# Patient Record
Sex: Female | Born: 1955 | Race: White | Hispanic: No | Smoking: Current every day smoker
Health system: Southern US, Community
[De-identification: ages and names within clinical notes are randomized; demographics above are authoritative.]

## PROBLEM LIST (undated history)

## (undated) DIAGNOSIS — N6019 Diffuse cystic mastopathy of unspecified breast: Secondary | ICD-10-CM

## (undated) DIAGNOSIS — I10 Essential (primary) hypertension: Secondary | ICD-10-CM

## (undated) DIAGNOSIS — J449 Chronic obstructive pulmonary disease, unspecified: Secondary | ICD-10-CM

## (undated) DIAGNOSIS — K219 Gastro-esophageal reflux disease without esophagitis: Secondary | ICD-10-CM

## (undated) DIAGNOSIS — N19 Unspecified kidney failure: Secondary | ICD-10-CM

## (undated) DIAGNOSIS — M1990 Unspecified osteoarthritis, unspecified site: Secondary | ICD-10-CM

## (undated) DIAGNOSIS — Z8619 Personal history of other infectious and parasitic diseases: Secondary | ICD-10-CM

## (undated) DIAGNOSIS — J189 Pneumonia, unspecified organism: Secondary | ICD-10-CM

## (undated) HISTORY — DX: Personal history of other infectious and parasitic diseases: Z86.19

## (undated) HISTORY — DX: Gastro-esophageal reflux disease without esophagitis: K21.9

## (undated) HISTORY — DX: Diffuse cystic mastopathy of unspecified breast: N60.19

## (undated) HISTORY — DX: Pneumonia, unspecified organism: J18.9

## (undated) HISTORY — DX: Essential (primary) hypertension: I10

## (undated) HISTORY — DX: Unspecified osteoarthritis, unspecified site: M19.90

## (undated) HISTORY — DX: Unspecified kidney failure: N19

## (undated) HISTORY — PX: CARPAL TUNNEL RELEASE: SHX101

## (undated) HISTORY — DX: Chronic obstructive pulmonary disease, unspecified: J44.9

---

## 1981-02-15 HISTORY — PX: ABDOMINAL HYSTERECTOMY: SHX81

## 2006-02-15 DIAGNOSIS — J189 Pneumonia, unspecified organism: Secondary | ICD-10-CM

## 2006-02-15 HISTORY — DX: Pneumonia, unspecified organism: J18.9

## 2007-08-07 ENCOUNTER — Emergency Department: Payer: Self-pay

## 2007-08-11 ENCOUNTER — Inpatient Hospital Stay: Payer: Self-pay | Admitting: Internal Medicine

## 2007-08-11 ENCOUNTER — Other Ambulatory Visit: Payer: Self-pay

## 2007-09-05 ENCOUNTER — Emergency Department: Payer: Self-pay | Admitting: Internal Medicine

## 2007-12-28 ENCOUNTER — Emergency Department: Payer: Self-pay | Admitting: Emergency Medicine

## 2008-04-12 ENCOUNTER — Emergency Department: Payer: Self-pay | Admitting: Emergency Medicine

## 2008-12-02 ENCOUNTER — Emergency Department: Payer: Self-pay | Admitting: Emergency Medicine

## 2008-12-05 ENCOUNTER — Emergency Department: Payer: Self-pay | Admitting: Emergency Medicine

## 2008-12-30 ENCOUNTER — Encounter: Admission: RE | Admit: 2008-12-30 | Discharge: 2008-12-30 | Payer: Self-pay | Admitting: Internal Medicine

## 2009-07-05 ENCOUNTER — Emergency Department: Payer: Self-pay | Admitting: Emergency Medicine

## 2009-12-25 ENCOUNTER — Emergency Department: Payer: Self-pay | Admitting: Emergency Medicine

## 2009-12-28 ENCOUNTER — Emergency Department: Payer: Self-pay | Admitting: Emergency Medicine

## 2010-12-08 ENCOUNTER — Ambulatory Visit: Payer: Self-pay | Admitting: Specialist

## 2011-02-11 ENCOUNTER — Encounter: Payer: Self-pay | Admitting: Internal Medicine

## 2011-02-11 ENCOUNTER — Ambulatory Visit (INDEPENDENT_AMBULATORY_CARE_PROVIDER_SITE_OTHER): Payer: BC Managed Care – PPO | Admitting: Internal Medicine

## 2011-02-11 DIAGNOSIS — M129 Arthropathy, unspecified: Secondary | ICD-10-CM

## 2011-02-11 DIAGNOSIS — F419 Anxiety disorder, unspecified: Secondary | ICD-10-CM | POA: Insufficient documentation

## 2011-02-11 DIAGNOSIS — S43429A Sprain of unspecified rotator cuff capsule, initial encounter: Secondary | ICD-10-CM

## 2011-02-11 DIAGNOSIS — J449 Chronic obstructive pulmonary disease, unspecified: Secondary | ICD-10-CM | POA: Insufficient documentation

## 2011-02-11 DIAGNOSIS — G4700 Insomnia, unspecified: Secondary | ICD-10-CM

## 2011-02-11 DIAGNOSIS — M75101 Unspecified rotator cuff tear or rupture of right shoulder, not specified as traumatic: Secondary | ICD-10-CM

## 2011-02-11 DIAGNOSIS — M1990 Unspecified osteoarthritis, unspecified site: Secondary | ICD-10-CM

## 2011-02-11 DIAGNOSIS — Z1239 Encounter for other screening for malignant neoplasm of breast: Secondary | ICD-10-CM

## 2011-02-11 DIAGNOSIS — R229 Localized swelling, mass and lump, unspecified: Secondary | ICD-10-CM

## 2011-02-11 DIAGNOSIS — R2230 Localized swelling, mass and lump, unspecified upper limb: Secondary | ICD-10-CM

## 2011-02-11 DIAGNOSIS — F411 Generalized anxiety disorder: Secondary | ICD-10-CM

## 2011-02-11 DIAGNOSIS — I10 Essential (primary) hypertension: Secondary | ICD-10-CM

## 2011-02-11 MED ORDER — IBUPROFEN 800 MG PO TABS: 800.0000 mg | ORAL_TABLET | Freq: Three times a day (TID) | ORAL | Status: DC | PRN

## 2011-02-11 MED ORDER — ESZOPICLONE 2 MG PO TABS: 2.0000 mg | ORAL_TABLET | Freq: Every day | ORAL | Status: DC

## 2011-02-11 MED ORDER — ALPRAZOLAM 1 MG PO TABS: 1.0000 mg | ORAL_TABLET | Freq: Two times a day (BID) | ORAL | Status: DC | PRN

## 2011-02-11 MED ORDER — LISINOPRIL-HYDROCHLOROTHIAZIDE 10-12.5 MG PO TABS: 2.0000 | ORAL_TABLET | Freq: Every day | ORAL | Status: DC

## 2011-02-11 MED ORDER — ALBUTEROL SULFATE (2.5 MG/3ML) 0.083% IN NEBU: 2.5000 mg | INHALATION_SOLUTION | Freq: Four times a day (QID) | RESPIRATORY_TRACT | Status: AC | PRN

## 2011-02-11 MED ORDER — FLUTICASONE-SALMETEROL 500-50 MCG/DOSE IN AEPB: 1.0000 | INHALATION_SPRAY | Freq: Two times a day (BID) | RESPIRATORY_TRACT | Status: DC

## 2011-02-11 NOTE — Progress Notes (Signed)
Subjective:    Patient ID: Teresa Copeland, female    DOB: [birth year], 55 y.o.   MRN: [id number]  HPI 55 year old female with a history of COPD presents for followup. She was seen previously at our former office. She has several concerns today. First, she reports significant difficulty falling asleep. She notes that she has been working long days at work, typically 12 hour shifts. When she gets home she is unable to follow sleep and typically only sleeps a couple of hours each night. Because of this she reports that she is constantly exhausted. She notes that she used Lunesta in the past with some improvement in her symptoms.  She also notes significant increase in her anxiety. She relates this to both the family events and to stress at work. She notes that in the past her blood pressure remained elevated until her anxiety was treated with Xanax. She is interested in restarting this medication today. She reports significant improvement in her anxiety with this medication the past.  She notes a long history of hypertension. She reports that she has been taking her lisinopril hydrochlorothiazide as directed. She notes that her blood pressure is typically higher when she is at the doctor's office. She did not bring a record of her blood pressure at home today. She has not recently had her renal function checked. She denies any chest pain, headache, visual changes.  She also notes a long history of smoking and history of COPD. She has not been taking her Advair because of cost. She denies any current shortness of breath or cough. She denies any fever or chills. She continues to smoke.  She is also concerned today about an area of fullness on the left side of her chest. She notes this has been present for several months. The area is not painful. She did not have any trauma to her chest. She has not noticed any changes in her breasts. She does note that her last mammogram was in 2008 and she was instructed to have  followup within 6 months. She did not keep the followup appointment.  Outpatient Encounter Prescriptions as of 02/11/2011  Medication Sig Dispense Refill  . albuterol (PROVENTIL HFA;VENTOLIN HFA) 108 (90 BASE) MCG/ACT inhaler Inhale 2 puffs into the lungs every 6 (six) hours as needed.        Marland Kitchen albuterol (PROVENTIL) (2.5 MG/3ML) 0.083% nebulizer solution Take 3 mLs (2.5 mg total) by nebulization every 6 (six) hours as needed.  75 mL  3  . ALPRAZolam (XANAX) 1 MG tablet Take 1 tablet (1 mg total) by mouth 2 (two) times daily as needed for sleep.  60 tablet  3  . eszopiclone (LUNESTA) 2 MG TABS Take 1 tablet (2 mg total) by mouth at bedtime. Take immediately before bedtime  30 tablet  0  . Fluticasone-Salmeterol (ADVAIR) 500-50 MCG/DOSE AEPB Inhale 1 puff into the lungs every 12 (twelve) hours.  60 each  6  . ibuprofen (ADVIL,MOTRIN) 800 MG tablet Take 1 tablet (800 mg total) by mouth every 8 (eight) hours as needed for pain.  90 tablet  0  . lisinopril-hydrochlorothiazide (PRINZIDE,ZESTORETIC) 10-12.5 MG per tablet Take 2 tablets by mouth daily.  60 tablet  3  . valACYclovir (VALTREX) 500 MG tablet         Review of Systems  Constitutional: Negative for fever, chills, appetite change, fatigue and unexpected weight change.  HENT: Negative for ear pain, congestion, sore throat, trouble swallowing, neck pain, voice change and sinus pressure.  Eyes: Negative for visual disturbance.  Respiratory: Negative for cough, shortness of breath, wheezing and stridor.   Cardiovascular: Negative for chest pain, palpitations and leg swelling.  Gastrointestinal: Negative for nausea, vomiting, abdominal pain, diarrhea, constipation, blood in stool, abdominal distention and anal bleeding.  Genitourinary: Negative for dysuria and flank pain.  Musculoskeletal: Positive for myalgias and arthralgias. Negative for gait problem.  Skin: Negative for color change and rash.  Neurological: Negative for dizziness and  headaches.  Hematological: Negative for adenopathy. Does not bruise/bleed easily.  Psychiatric/Behavioral: Positive for sleep disturbance. Negative for suicidal ideas and dysphoric mood. The patient is nervous/anxious.    BP 208/110  Pulse 81  Temp(Src) 98.5 F (36.9 C) (Oral)  Wt 177 lb (80.287 kg)  SpO2 99%     Objective:   Physical Exam  Constitutional: She is oriented to person, place, and time. She appears well-developed and well-nourished. No distress.  HENT:  Head: Normocephalic and atraumatic.  Right Ear: External ear normal.  Left Ear: External ear normal.  Nose: Nose normal.  Mouth/Throat: Oropharynx is clear and moist. No oropharyngeal exudate.  Eyes: Conjunctivae are normal. Pupils are equal, round, and reactive to light. Right eye exhibits no discharge. Left eye exhibits no discharge. No scleral icterus.  Neck: Normal range of motion. Neck supple. No tracheal deviation present. No thyromegaly present.  Cardiovascular: Normal rate, regular rhythm, normal heart sounds and intact distal pulses.  Exam reveals no gallop and no friction rub.   No murmur heard. Pulmonary/Chest: Effort normal. No accessory muscle usage. Not tachypneic. No respiratory distress. She has decreased breath sounds (prolonged expiration). She has no wheezes. She has no rales. She exhibits no tenderness. Right breast exhibits no inverted nipple, no mass, no nipple discharge, no skin change and no tenderness. Left breast exhibits no inverted nipple, no mass, no nipple discharge, no skin change and no tenderness. Breasts are asymmetrical.    Musculoskeletal: Normal range of motion. She exhibits no edema and no tenderness.  Lymphadenopathy:    She has no cervical adenopathy.  Neurological: She is alert and oriented to person, place, and time. No cranial nerve deficit. She exhibits normal muscle tone. Coordination normal.  Skin: Skin is warm and dry. No rash noted. She is not diaphoretic. No erythema. No  pallor.  Psychiatric: She has a normal mood and affect. Her behavior is normal. Judgment and thought content normal.          Assessment & Plan:  1. Insomnia -will restart Lunesta. Samples were given today. Patient will followup in one month.  2. Anxiety -will restart Xanax. Patient was given prescription for this today. She will followup in one month.  3. Hypertension -blood pressure is markedly elevated. Will increase her lisinopril hydrochlorothiazide to twice daily. She will followup for her nurse blood pressure check in one week. She will followup for lab work including creatinine and potassium levels in one week. She will return to clinic for an office visit in one month.  4. Left axillary fullness -patient has an area of fullness in her left axilla and lateral chest wall. Question if this might be a lipoma. Will get an ultrasound of the area for evaluation.  5. Breast cancer screening -patient is overdue for her mammogram. We'll schedule this for her today.  6. COPD - will restart Advair and albuterol. If Advair is too expensive with her new insurance will consider change to spiriva. Strongly encourage smoking cessation. Patient will followup in one month.

## 2011-02-12 ENCOUNTER — Other Ambulatory Visit: Payer: Self-pay | Admitting: *Deleted

## 2011-02-12 ENCOUNTER — Telehealth: Payer: Self-pay | Admitting: Internal Medicine

## 2011-02-12 ENCOUNTER — Ambulatory Visit: Payer: Self-pay | Admitting: Internal Medicine

## 2011-02-12 DIAGNOSIS — M1990 Unspecified osteoarthritis, unspecified site: Secondary | ICD-10-CM

## 2011-02-12 DIAGNOSIS — J449 Chronic obstructive pulmonary disease, unspecified: Secondary | ICD-10-CM

## 2011-02-12 MED ORDER — IBUPROFEN 800 MG PO TABS: 800.0000 mg | ORAL_TABLET | Freq: Three times a day (TID) | ORAL | Status: DC | PRN

## 2011-02-12 MED ORDER — FLUTICASONE-SALMETEROL 500-50 MCG/DOSE IN AEPB: 1.0000 | INHALATION_SPRAY | Freq: Two times a day (BID) | RESPIRATORY_TRACT | Status: DC

## 2011-02-12 NOTE — Telephone Encounter (Signed)
[place] of left chest wall was normal.  If symptoms persist, we can consider MRI of the shoulder. We can discuss at her follow up visit.

## 2011-02-12 NOTE — Telephone Encounter (Signed)
Left detailed VM on hm #

## 2011-02-12 NOTE — Telephone Encounter (Signed)
Order was e-prescribed today, and I resent it now.

## 2011-02-12 NOTE — Telephone Encounter (Signed)
I called patient to let her know that this was sent again by Dr. Dan Humphreys to [facility].  She is going to call them and make sure they do have it.

## 2011-02-18 ENCOUNTER — Other Ambulatory Visit (INDEPENDENT_AMBULATORY_CARE_PROVIDER_SITE_OTHER): Payer: BC Managed Care – PPO | Admitting: *Deleted

## 2011-02-18 ENCOUNTER — Telehealth: Payer: Self-pay | Admitting: *Deleted

## 2011-02-18 ENCOUNTER — Ambulatory Visit (INDEPENDENT_AMBULATORY_CARE_PROVIDER_SITE_OTHER): Payer: BC Managed Care – PPO | Admitting: *Deleted

## 2011-02-18 VITALS — BP 140/86 | HR 80 | Resp 16

## 2011-02-18 DIAGNOSIS — I10 Essential (primary) hypertension: Secondary | ICD-10-CM

## 2011-02-18 LAB — BASIC METABOLIC PANEL
BUN: 11 mg/dL (ref 6–23)
Chloride: 106 mEq/L (ref 96–112)
Creatinine, Ser: 0.9 mg/dL (ref 0.4–1.2)
GFR: 72.73 mL/min (ref >60.00)
Glucose, Bld: 92 mg/dL (ref 70–99)

## 2011-02-18 NOTE — Telephone Encounter (Signed)
Patient is asking if she can get a rx for diflucan for thrush. She says that every time she has ever used the advair she gets thrush in her mouth. Also she is asking if she could get handicap placard since she is a copd patient.

## 2011-02-18 NOTE — Progress Notes (Signed)
Patient came in for blood pressure check today as instructed. When she was here for her office visit her blood pressure was elevated at 208/110. Today it was 140/86. Medication reviewed, patient is taking lisinopril-hydrochlorothiazide 10-12.5, two tabs daily.

## 2011-02-20 NOTE — Telephone Encounter (Signed)
Fine to call in Diflucan 150mg  daily disp 1.  Fine to give Handicap tag.

## 2011-02-24 ENCOUNTER — Encounter: Payer: Self-pay | Admitting: Internal Medicine

## 2011-03-08 ENCOUNTER — Telehealth: Payer: Self-pay | Admitting: *Deleted

## 2011-03-08 NOTE — Telephone Encounter (Signed)
Patient requesting RX for diflucan for thrush in her mouth. Please advise.

## 2011-03-08 NOTE — Telephone Encounter (Signed)
Fine to call in diflucan 150mg  po daily x 3 days.

## 2011-03-09 MED ORDER — FLUCONAZOLE 150 MG PO TABS: 150.0000 mg | ORAL_TABLET | Freq: Once | ORAL | Status: DC

## 2011-03-09 NOTE — Telephone Encounter (Signed)
Patient notified to check w/ pharmacy for rx.

## 2011-03-15 ENCOUNTER — Ambulatory Visit (INDEPENDENT_AMBULATORY_CARE_PROVIDER_SITE_OTHER): Payer: BC Managed Care – PPO | Admitting: Internal Medicine

## 2011-03-15 ENCOUNTER — Encounter: Payer: Self-pay | Admitting: Internal Medicine

## 2011-03-15 ENCOUNTER — Ambulatory Visit (INDEPENDENT_AMBULATORY_CARE_PROVIDER_SITE_OTHER)
Admission: RE | Admit: 2011-03-15 | Discharge: 2011-03-15 | Disposition: A | Discharge status: Home / Self Care | Payer: BC Managed Care – PPO | Source: Ambulatory Visit | Attending: Internal Medicine | Admitting: Internal Medicine

## 2011-03-15 VITALS — BP 146/78 | HR 106 | Temp 98.2°F | Ht 68.0 in | Wt 172.0 lb

## 2011-03-15 DIAGNOSIS — M79609 Pain in unspecified limb: Secondary | ICD-10-CM

## 2011-03-15 DIAGNOSIS — S81809A Unspecified open wound, unspecified lower leg, initial encounter: Secondary | ICD-10-CM

## 2011-03-15 DIAGNOSIS — I10 Essential (primary) hypertension: Secondary | ICD-10-CM

## 2011-03-15 DIAGNOSIS — M79602 Pain in left arm: Secondary | ICD-10-CM

## 2011-03-15 DIAGNOSIS — S81801A Unspecified open wound, right lower leg, initial encounter: Secondary | ICD-10-CM

## 2011-03-15 DIAGNOSIS — G4700 Insomnia, unspecified: Secondary | ICD-10-CM

## 2011-03-15 MED ORDER — HYDROCODONE-ACETAMINOPHEN 5-500 MG PO TABS: 1.0000 | ORAL_TABLET | Freq: Four times a day (QID) | ORAL | Status: DC | PRN

## 2011-03-15 MED ORDER — GENTAMICIN SULFATE 0.1 % EX OINT: TOPICAL_OINTMENT | Freq: Three times a day (TID) | CUTANEOUS | Status: DC

## 2011-03-15 NOTE — Assessment & Plan Note (Signed)
Improved with use of Lunesta. We'll plan to continue. Followup in one month.

## 2011-03-15 NOTE — Assessment & Plan Note (Signed)
Right knee wound consistent with abrasion after recent fall. Will have patient apply gentamycin cream twice daily. She will call or return to clinic if wound is not improving.

## 2011-03-15 NOTE — Assessment & Plan Note (Signed)
Patient with left arm pain after recent fall. Extensive bruising noted. Area is tender to palpation. Will get plain x-ray to eval for fracture. We'll use hydrocodone as needed for pain.

## 2011-03-15 NOTE — Progress Notes (Signed)
Subjective:    Patient ID: Teresa Copeland, female    DOB: 17-Oct-1955, 56 y.o.   MRN: [id number]  HPI 56 year old female with history of hypertension and insomnia presents for followup. In regards to her hypertension, she reports her blood pressure has been much better controlled with the increase in her medication lisinopril hydrochlorothiazide to twice daily. She denies any headache, chest pain, palpitations. She reports full compliance with her medicine.  In regards to her insomnia, she also notes improvement with the use of Lunesta. She denies any side effects noted from this medication.  Her primary concern today is a left upper arm pain after recent fall at work. She reports that she fell on her left upper arm while standing at work last week. She reports severe pain in her upper arm and extensive bruising. She also has a wound on her right knee. She denies any head injury or loss of consciousness during her fall. She notes that she lost her balance and did not have any preceding syncope or other event. She has been applying a bandage to her right knee. She notes that the area has become yellow in color and she is concerned it may be infected. She denies any fever or chills. In regards to her left arm, she reports that any movement of her left shoulder results in significant pain. She has been using over-the-counter Tylenol and ibuprofen with minimal improvement.  Outpatient Encounter Prescriptions as of 03/15/2011  Medication Sig Dispense Refill  . albuterol (PROVENTIL HFA;VENTOLIN HFA) 108 (90 BASE) MCG/ACT inhaler Inhale 2 puffs into the lungs every 6 (six) hours as needed.        Marland Kitchen albuterol (PROVENTIL) (2.5 MG/3ML) 0.083% nebulizer solution Take 3 mLs (2.5 mg total) by nebulization every 6 (six) hours as needed.  75 mL  3  . eszopiclone (LUNESTA) 2 MG TABS Take 1 tablet (2 mg total) by mouth at bedtime. Take immediately before bedtime  30 tablet  0  . Fluticasone-Salmeterol (ADVAIR) 500-50  MCG/DOSE AEPB Inhale 1 puff into the lungs every 12 (twelve) hours.  180 each  1  . lisinopril-hydrochlorothiazide (PRINZIDE,ZESTORETIC) 20-12.5 MG per tablet Take 1 tablet by mouth daily.      . valACYclovir (VALTREX) 500 MG tablet       . DISCONTD: lisinopril-hydrochlorothiazide (PRINZIDE,ZESTORETIC) 10-12.5 MG per tablet Take 2 tablets by mouth daily.  60 tablet  3  . gentamicin ointment (GARAMYCIN) 0.1 % Apply topically 3 (three) times daily.  15 g  0  . HYDROcodone-acetaminophen (VICODIN) 5-500 MG per tablet Take 1 tablet by mouth every 6 (six) hours as needed for pain.  60 tablet  0    Review of Systems  Constitutional: Negative for fever, chills, appetite change, fatigue and unexpected weight change.  HENT: Negative for neck pain.   Eyes: Negative for visual disturbance.  Respiratory: Negative for cough, shortness of breath, wheezing and stridor.   Cardiovascular: Negative for chest pain, palpitations and leg swelling.  Genitourinary: Negative for dysuria and flank pain.  Musculoskeletal: Positive for myalgias and arthralgias. Negative for gait problem.  Skin: Positive for color change and wound. Negative for rash.  Neurological: Negative for dizziness and headaches.  Hematological: Negative for adenopathy. Does not bruise/bleed easily.  Psychiatric/Behavioral: Positive for sleep disturbance. Negative for suicidal ideas and dysphoric mood. The patient is not nervous/anxious.       BP 146/78  Pulse 106  Temp(Src) 98.2 F (36.8 C) (Oral)  Ht 5\' 8"  (1.727 m)  Wt 172 lb (  78.019 kg)  BMI 26.15 kg/m2  SpO2 97%  Objective:   Physical Exam  Constitutional: She is oriented to person, place, and time. She appears well-developed and well-nourished. No distress.  HENT:  Head: Normocephalic and atraumatic.  Right Ear: External ear normal.  Left Ear: External ear normal.  Nose: Nose normal.  Mouth/Throat: Oropharynx is clear and moist. No oropharyngeal exudate.  Eyes: Conjunctivae  are normal. Pupils are equal, round, and reactive to light. Right eye exhibits no discharge. Left eye exhibits no discharge. No scleral icterus.  Neck: Normal range of motion. Neck supple. No tracheal deviation present. No thyromegaly present.  Cardiovascular: Normal rate, regular rhythm, normal heart sounds and intact distal pulses.  Exam reveals no gallop and no friction rub.   No murmur heard. Pulmonary/Chest: Effort normal and breath sounds normal. No respiratory distress. She has no wheezes. She has no rales. She exhibits no tenderness.  Musculoskeletal: Normal range of motion. She exhibits no edema and no tenderness.       Left upper arm: She exhibits tenderness, bony tenderness, swelling and edema. She exhibits no deformity.       Arms: Lymphadenopathy:    She has no cervical adenopathy.  Neurological: She is alert and oriented to person, place, and time. No cranial nerve deficit. She exhibits normal muscle tone. Coordination normal.  Skin: Skin is warm and dry. No rash noted. She is not diaphoretic. No erythema. No pallor.  Psychiatric: She has a normal mood and affect. Her behavior is normal. Judgment and thought content normal.          Assessment & Plan:

## 2011-03-15 NOTE — Assessment & Plan Note (Signed)
Blood pressure improved with recent change in medication. We'll continue to monitor. Followup one month.

## 2011-03-17 ENCOUNTER — Telehealth: Payer: Self-pay | Admitting: Internal Medicine

## 2011-03-19 NOTE — Telephone Encounter (Signed)
OK for referral for colon? Is this screening?

## 2011-03-20 ENCOUNTER — Encounter: Payer: Self-pay | Admitting: Internal Medicine

## 2011-03-25 ENCOUNTER — Ambulatory Visit: Payer: Self-pay | Admitting: Internal Medicine

## 2011-03-29 ENCOUNTER — Ambulatory Visit (INDEPENDENT_AMBULATORY_CARE_PROVIDER_SITE_OTHER): Payer: BC Managed Care – PPO | Admitting: Internal Medicine

## 2011-03-29 ENCOUNTER — Encounter: Payer: Self-pay | Admitting: Internal Medicine

## 2011-03-29 VITALS — BP 122/72 | HR 97 | Temp 98.2°F | Ht 68.0 in | Wt 174.0 lb

## 2011-03-29 DIAGNOSIS — M719 Bursopathy, unspecified: Secondary | ICD-10-CM

## 2011-03-29 DIAGNOSIS — M7552 Bursitis of left shoulder: Secondary | ICD-10-CM

## 2011-03-29 DIAGNOSIS — M79602 Pain in left arm: Secondary | ICD-10-CM

## 2011-03-29 DIAGNOSIS — M2550 Pain in unspecified joint: Secondary | ICD-10-CM | POA: Insufficient documentation

## 2011-03-29 DIAGNOSIS — M79609 Pain in unspecified limb: Secondary | ICD-10-CM

## 2011-03-29 DIAGNOSIS — I10 Essential (primary) hypertension: Secondary | ICD-10-CM

## 2011-03-29 MED ORDER — HYDROCODONE-ACETAMINOPHEN 5-500 MG PO TABS: 1.0000 | ORAL_TABLET | Freq: Four times a day (QID) | ORAL | Status: DC | PRN

## 2011-03-29 MED ORDER — PREDNISONE (PAK) 10 MG PO TABS: ORAL_TABLET | ORAL | Status: AC

## 2011-03-29 MED ORDER — LISINOPRIL-HYDROCHLOROTHIAZIDE 20-12.5 MG PO TABS: 1.0000 | ORAL_TABLET | Freq: Every day | ORAL | Status: DC

## 2011-03-29 NOTE — Assessment & Plan Note (Signed)
Blood pressure well-controlled today. Will recheck renal function with labs. Followup in one month.

## 2011-03-29 NOTE — Progress Notes (Signed)
Subjective:    Patient ID: Teresa Copeland, female    DOB: [birth year], 56 y.o.   MRN: [id number]  HPI 56 year old female with history of hypertension and chronic arthralgia presents for followup. In regards to her hypertension, she reports compliance with her medication, she also reports no headache, chest pain, or palpitations. She did not bring a record of her blood pressure today.  In regards to her chronic arthralgias, she notes that she has significant pain in both of her hands which is most prominent in the mornings. She has noted some nodular areas over her joints. She has not noticed any redness or swelling. She notes that her mother has similar nodularity. She is currently using Vicodin as needed for severe pain with relief of her symptoms.  In regards to her right shoulder rotator cuff tear, she reports that she is waiting to have surgical repair until she has time off from work. In regards to her left shoulder pain, she reports significant increase in her pain in the anterior shoulder over the last few days. Pain described as sharp. It does not radiate. Pain prevents her from abducting her left arm. She reports that this is consistent with her history of recurrent bursitis. She has had steroid injection in the past with improvement. She feels that her symptoms are exacerbated by her work activities. She works Social research officer, [employer] for [employer]. She is currently using Vicodin as needed for severe pain with some improvement in her symptoms.  Outpatient Encounter Prescriptions as of 03/29/2011  Medication Sig Dispense Refill  . albuterol (PROVENTIL HFA;VENTOLIN HFA) 108 (90 BASE) MCG/ACT inhaler Inhale 2 puffs into the lungs every 6 (six) hours as needed.        Marland Kitchen albuterol (PROVENTIL) (2.5 MG/3ML) 0.083% nebulizer solution Take 3 mLs (2.5 mg total) by nebulization every 6 (six) hours as needed.  75 mL  3  . eszopiclone (LUNESTA) 2 MG TABS Take 1 tablet (2 mg total) by mouth at bedtime. Take immediately  before bedtime  30 tablet  0  . Fluticasone-Salmeterol (ADVAIR) 500-50 MCG/DOSE AEPB Inhale 1 puff into the lungs every 12 (twelve) hours.  180 each  1  . gentamicin ointment (GARAMYCIN) 0.1 % Apply topically 3 (three) times daily.  15 g  0  . lisinopril-hydrochlorothiazide (PRINZIDE,ZESTORETIC) 20-12.5 MG per tablet Take 1 tablet by mouth daily.  90 tablet  1  . valACYclovir (VALTREX) 500 MG tablet       . DISCONTD: lisinopril-hydrochlorothiazide (PRINZIDE,ZESTORETIC) 20-12.5 MG per tablet Take 1 tablet by mouth daily.      Marland Kitchen HYDROcodone-acetaminophen (VICODIN) 5-500 MG per tablet Take 1 tablet by mouth every 6 (six) hours as needed for pain.  60 tablet  0  . predniSONE (STERAPRED UNI-PAK) 10 MG tablet Take 60mg  day 1 then taper by 10mg  daily  21 tablet  0    Review of Systems  Constitutional: Positive for fatigue. Negative for fever, chills, appetite change and unexpected weight change.  HENT: Negative for neck pain.   Eyes: Negative for visual disturbance.  Respiratory: Negative for cough, shortness of breath, wheezing and stridor.   Cardiovascular: Negative for chest pain, palpitations and leg swelling.  Gastrointestinal: Negative for abdominal pain.  Genitourinary: Negative for dysuria and flank pain.  Musculoskeletal: Positive for myalgias, back pain and arthralgias. Negative for joint swelling and gait problem.  Skin: Negative for color change and rash.  Neurological: Positive for weakness. Negative for dizziness and headaches.  Hematological: Negative for adenopathy. Does not bruise/bleed easily.  Psychiatric/Behavioral: Negative for suicidal ideas, sleep disturbance and dysphoric mood. The patient is not nervous/anxious.    BP 122/72  Pulse 97  Temp(Src) 98.2 F (36.8 C) (Oral)  Ht 5\' 8"  (1.727 m)  Wt 174 lb (78.926 kg)  BMI 26.46 kg/m2  SpO2 97%     Objective:   Physical Exam  Constitutional: She is oriented to person, place, and time. She appears well-developed and  well-nourished. No distress.  HENT:  Head: Normocephalic and atraumatic.  Right Ear: External ear normal.  Left Ear: External ear normal.  Nose: Nose normal.  Mouth/Throat: Oropharynx is clear and moist. No oropharyngeal exudate.  Eyes: Conjunctivae are normal. Pupils are equal, round, and reactive to light. Right eye exhibits no discharge. Left eye exhibits no discharge. No scleral icterus.  Neck: Normal range of motion. Neck supple. No tracheal deviation present. No thyromegaly present.  Cardiovascular: Normal rate, regular rhythm, normal heart sounds and intact distal pulses.  Exam reveals no gallop and no friction rub.   No murmur heard. Pulmonary/Chest: Effort normal and breath sounds normal. No respiratory distress. She has no wheezes. She has no rales. She exhibits no tenderness.  Musculoskeletal: She exhibits no edema and no tenderness.       Right shoulder: She exhibits decreased range of motion, pain and decreased strength.       Left shoulder: She exhibits decreased range of motion, tenderness, bony tenderness, pain and spasm.       Right hand: She exhibits normal range of motion, no deformity and no swelling.       Left hand: She exhibits normal range of motion, no deformity and no swelling.  Lymphadenopathy:    She has no cervical adenopathy.  Neurological: She is alert and oriented to person, place, and time. No cranial nerve deficit. She exhibits normal muscle tone. Coordination normal.  Skin: Skin is warm and dry. No rash noted. She is not diaphoretic. No erythema. No pallor.  Psychiatric: She has a normal mood and affect. Her behavior is normal. Judgment and thought content normal.          Assessment & Plan:

## 2011-03-29 NOTE — Assessment & Plan Note (Signed)
Recurrent. Will give steroid injection and taper today. Patient will followup with her orthopedic surgeon. She will use Vicodin as needed for pain. No further Vicodin refills until she is seen by orthopedic surgeon.

## 2011-03-29 NOTE — Assessment & Plan Note (Signed)
Chronic. Symptoms and exam seem most consistent with rheumatoid arthritis. Will check lab work including ANA, rheumatoid factor, ESR, and CRP.

## 2011-03-29 NOTE — Progress Notes (Signed)
Addended by: Jobie Quaker on: 03/29/2011 01:56 PM   Modules accepted: Orders

## 2011-03-30 LAB — COMPREHENSIVE METABOLIC PANEL
ALT: 20 U/L (ref 0–35)
CO2: 21 mEq/L (ref 19–32)
Calcium: 10.2 mg/dL (ref 8.4–10.5)
Chloride: 103 mEq/L (ref 96–112)
Glucose, Bld: 85 mg/dL (ref 70–99)
Sodium: 137 mEq/L (ref 135–145)
Total Bilirubin: 0.3 mg/dL (ref 0.3–1.2)
Total Protein: 6.9 g/dL (ref 6.0–8.3)

## 2011-03-30 MED ORDER — METHYLPREDNISOLONE ACETATE PF 40 MG/ML IJ SUSP
40.0000 mg | Freq: Once | INTRAMUSCULAR | Status: AC
  Administered 2011-03-30: 40 mg via INTRAMUSCULAR

## 2011-03-30 NOTE — Progress Notes (Signed)
Addended by: Vernie Murders on: 03/30/2011 11:58 AM   Modules accepted: Orders

## 2011-03-31 LAB — ANA: Anti Nuclear Antibody(ANA): NEGATIVE

## 2011-04-05 ENCOUNTER — Encounter: Payer: Self-pay | Admitting: Internal Medicine

## 2011-04-07 ENCOUNTER — Telehealth: Payer: Self-pay | Admitting: Internal Medicine

## 2011-04-07 MED ORDER — LISINOPRIL-HYDROCHLOROTHIAZIDE 10-12.5 MG PO TABS: 1.0000 | ORAL_TABLET | Freq: Two times a day (BID) | ORAL | Status: DC

## 2011-04-07 NOTE — Telephone Encounter (Signed)
I sent mychart message

## 2011-04-08 ENCOUNTER — Ambulatory Visit: Payer: Self-pay | Admitting: Family Medicine

## 2011-04-10 ENCOUNTER — Ambulatory Visit: Payer: Self-pay

## 2011-04-20 ENCOUNTER — Encounter: Payer: Self-pay | Admitting: Internal Medicine

## 2011-04-22 ENCOUNTER — Encounter: Payer: Self-pay | Admitting: Internal Medicine

## 2011-04-22 ENCOUNTER — Ambulatory Visit: Payer: Self-pay

## 2011-06-23 ENCOUNTER — Ambulatory Visit (INDEPENDENT_AMBULATORY_CARE_PROVIDER_SITE_OTHER): Payer: BC Managed Care – PPO | Admitting: Internal Medicine

## 2011-06-23 ENCOUNTER — Encounter: Payer: Self-pay | Admitting: Internal Medicine

## 2011-06-23 VITALS — BP 118/78 | HR 78 | Temp 98.9°F | Wt 176.5 lb

## 2011-06-23 DIAGNOSIS — B379 Candidiasis, unspecified: Secondary | ICD-10-CM

## 2011-06-23 DIAGNOSIS — F419 Anxiety disorder, unspecified: Secondary | ICD-10-CM

## 2011-06-23 DIAGNOSIS — J449 Chronic obstructive pulmonary disease, unspecified: Secondary | ICD-10-CM

## 2011-06-23 DIAGNOSIS — M719 Bursopathy, unspecified: Secondary | ICD-10-CM

## 2011-06-23 DIAGNOSIS — M7552 Bursitis of left shoulder: Secondary | ICD-10-CM

## 2011-06-23 DIAGNOSIS — F411 Generalized anxiety disorder: Secondary | ICD-10-CM

## 2011-06-23 DIAGNOSIS — E785 Hyperlipidemia, unspecified: Secondary | ICD-10-CM | POA: Insufficient documentation

## 2011-06-23 DIAGNOSIS — I10 Essential (primary) hypertension: Secondary | ICD-10-CM

## 2011-06-23 DIAGNOSIS — G4700 Insomnia, unspecified: Secondary | ICD-10-CM

## 2011-06-23 DIAGNOSIS — M2550 Pain in unspecified joint: Secondary | ICD-10-CM

## 2011-06-23 MED ORDER — ATORVASTATIN CALCIUM 20 MG PO TABS: 20.0000 mg | ORAL_TABLET | Freq: Every day | ORAL | Status: DC

## 2011-06-23 MED ORDER — FLUCONAZOLE 150 MG PO TABS: 150.0000 mg | ORAL_TABLET | Freq: Once | ORAL | Status: AC

## 2011-06-23 MED ORDER — ALPRAZOLAM 1 MG PO TABS: 1.0000 mg | ORAL_TABLET | Freq: Two times a day (BID) | ORAL | Status: AC | PRN

## 2011-06-23 MED ORDER — HYDROCODONE-ACETAMINOPHEN 5-500 MG PO TABS: 1.0000 | ORAL_TABLET | Freq: Four times a day (QID) | ORAL | Status: AC | PRN

## 2011-06-23 NOTE — Assessment & Plan Note (Signed)
Symptoms currently well-controlled with use of Advair and albuterol as needed. We'll continue to monitor.

## 2011-06-23 NOTE — Assessment & Plan Note (Signed)
Blood pressure is well-controlled today. We'll continue lisinopril hydrochlorothiazide. Followup in 3 months or sooner if needed.

## 2011-06-23 NOTE — Assessment & Plan Note (Signed)
Lipids markedly elevated based on labs from her work. Triglycerides elevated at greater than 500. Will try first adding statin. Will start Lipitor 20 mg daily. Patient will return to clinic in one month for lipid profile and LFTs.

## 2011-06-23 NOTE — Assessment & Plan Note (Signed)
Patient with significant pain in her left chest wall, left shoulder, and right shoulder after an injury at work. She has documented left rotator cuff tear and right rotator cuff tear. She has documented left rib fractures. She is in the process of establishing care with orthopedic surgeon through [facility]. Will refill Vicodin for her to use as needed for severe pain today.

## 2011-06-23 NOTE — Assessment & Plan Note (Signed)
Symptoms well-controlled with Lunesta. Will refill medication today.

## 2011-06-23 NOTE — Patient Instructions (Signed)
Return for labwork in 1 month.  Follow up 6 weeks.

## 2011-06-23 NOTE — Progress Notes (Signed)
Subjective:    Patient ID: Teresa Copeland, female    DOB: [birth year], 56 y.o.   MRN: [id number]  HPI 56 year old female with history of COPD, insomnia, hypertension, hyperlipidemia presents for followup. Her primary concern today is recent injury which she sustained at work when she fell. She brings report today showing that she fractured several ribs on the left in for her left rotator cuff. She is in the process of establishing Workmen's Comp. and arranging orthopedic care. She reports significant pain in her left shoulder and left rib cage. She has been using tramadol with no improvement in her symptoms. She occasionally takes the Vicodin at night with some improvement which allows her to sleep. She denies shortness of breath or cough. She reports full compliance with her inhalers.  In regards to her hypertension she reports full compliance with medications. She denies any chest pain, palpitations, myalgia. In regards to hyperlipidemia, she brings record from recent screen at work which showed triglycerides were markedly elevated at greater than 500. She denies any abdominal pain, nausea, vomiting, or other symptoms.  Outpatient Encounter Prescriptions as of 06/23/2011  Medication Sig Dispense Refill  . albuterol (PROVENTIL HFA;VENTOLIN HFA) 108 (90 BASE) MCG/ACT inhaler Inhale 2 puffs into the lungs every 6 (six) hours as needed.        Marland Kitchen albuterol (PROVENTIL) (2.5 MG/3ML) 0.083% nebulizer solution Take 3 mLs (2.5 mg total) by nebulization every 6 (six) hours as needed.  75 mL  3  . eszopiclone (LUNESTA) 2 MG TABS Take 1 tablet (2 mg total) by mouth at bedtime. Take immediately before bedtime  30 tablet  0  . Fluticasone-Salmeterol (ADVAIR) 500-50 MCG/DOSE AEPB Inhale 1 puff into the lungs every 12 (twelve) hours.  180 each  1  . gentamicin ointment (GARAMYCIN) 0.1 % Apply topically 3 (three) times daily.  15 g  0  . lisinopril-hydrochlorothiazide (PRINZIDE,ZESTORETIC) 10-12.5 MG per tablet Take 1  tablet by mouth 2 (two) times daily.  180 tablet  1  . valACYclovir (VALTREX) 500 MG tablet       . ALPRAZolam (XANAX) 1 MG tablet Take 1 tablet (1 mg total) by mouth 2 (two) times daily as needed for sleep.  60 tablet  3  . atorvastatin (LIPITOR) 20 MG tablet Take 1 tablet (20 mg total) by mouth daily.  90 tablet  3  . fluconazole (DIFLUCAN) 150 MG tablet Take 1 tablet (150 mg total) by mouth once.  3 tablet  0  . HYDROcodone-acetaminophen (VICODIN) 5-500 MG per tablet Take 1 tablet by mouth every 6 (six) hours as needed for pain.  90 tablet  2    Review of Systems  Constitutional: Negative for fever, chills, appetite change, fatigue and unexpected weight change.  HENT: Negative for ear pain, congestion, sore throat, trouble swallowing, neck pain, voice change and sinus pressure.   Eyes: Negative for visual disturbance.  Respiratory: Negative for cough, shortness of breath, wheezing and stridor.   Cardiovascular: Negative for chest pain, palpitations and leg swelling.  Gastrointestinal: Negative for nausea, vomiting, abdominal pain, diarrhea, constipation, blood in stool, abdominal distention and anal bleeding.  Genitourinary: Negative for dysuria and flank pain.  Musculoskeletal: Positive for back pain, joint swelling and arthralgias. Negative for myalgias and gait problem.  Skin: Negative for color change and rash.  Neurological: Positive for weakness. Negative for dizziness and headaches.  Hematological: Negative for adenopathy. Does not bruise/bleed easily.  Psychiatric/Behavioral: Negative for suicidal ideas, sleep disturbance and dysphoric mood. The patient is not  nervous/anxious.    BP 118/78  Pulse 78  Temp(Src) 98.9 F (37.2 C) (Oral)  Wt 176 lb 8 oz (80.06 kg)  SpO2 97%     Objective:   Physical Exam  Constitutional: She is oriented to person, place, and time. She appears well-developed and well-nourished. No distress.  HENT:  Head: Normocephalic and atraumatic.  Right  Ear: External ear normal.  Left Ear: External ear normal.  Nose: Nose normal.  Mouth/Throat: Oropharynx is clear and moist. No oropharyngeal exudate.  Eyes: Conjunctivae are normal. Pupils are equal, round, and reactive to light. Right eye exhibits no discharge. Left eye exhibits no discharge. No scleral icterus.  Neck: Normal range of motion. Neck supple. No tracheal deviation present. No thyromegaly present.  Cardiovascular: Normal rate, regular rhythm, normal heart sounds and intact distal pulses.  Exam reveals no gallop and no friction rub.   No murmur heard. Pulmonary/Chest: Effort normal and breath sounds normal. No respiratory distress. She has no wheezes. She has no rales. She exhibits no tenderness.  Musculoskeletal: She exhibits no edema and no tenderness.       Right shoulder: She exhibits decreased range of motion, tenderness, swelling and pain.       Left shoulder: She exhibits decreased range of motion, tenderness, swelling and pain.  Lymphadenopathy:    She has no cervical adenopathy.  Neurological: She is alert and oriented to person, place, and time. No cranial nerve deficit. She exhibits normal muscle tone. Coordination normal.  Skin: Skin is warm and dry. No rash noted. She is not diaphoretic. No erythema. No pallor.  Psychiatric: She has a normal mood and affect. Her behavior is normal. Judgment and thought content normal.          Assessment & Plan:

## 2011-07-08 ENCOUNTER — Ambulatory Visit: Payer: Self-pay | Admitting: Gastroenterology

## 2011-07-22 ENCOUNTER — Telehealth: Payer: Self-pay | Admitting: *Deleted

## 2011-07-22 NOTE — Telephone Encounter (Signed)
Patient is coming for labs tomorrow. What labs would you like for me to order ?

## 2011-07-22 NOTE — Telephone Encounter (Signed)
CMP and lipids.

## 2011-07-23 ENCOUNTER — Other Ambulatory Visit (INDEPENDENT_AMBULATORY_CARE_PROVIDER_SITE_OTHER): Payer: BC Managed Care – PPO | Admitting: *Deleted

## 2011-07-23 DIAGNOSIS — E785 Hyperlipidemia, unspecified: Secondary | ICD-10-CM

## 2011-07-23 LAB — COMPREHENSIVE METABOLIC PANEL
ALT: 25 U/L (ref 0–35)
AST: 24 U/L (ref 0–37)
Albumin: 3.9 g/dL (ref 3.5–5.2)
Alkaline Phosphatase: 86 U/L (ref 39–117)
Glucose, Bld: 91 mg/dL (ref 70–99)
Potassium: 4.2 mEq/L (ref 3.5–5.1)
Sodium: 137 mEq/L (ref 135–145)
Total Bilirubin: 0.4 mg/dL (ref 0.3–1.2)
Total Protein: 6.9 g/dL (ref 6.0–8.3)

## 2011-07-23 LAB — LIPID PANEL
Cholesterol: 217 mg/dL — ABNORMAL HIGH (ref 0–200)
Total CHOL/HDL Ratio: 3
VLDL: 127.2 mg/dL — ABNORMAL HIGH (ref 0.0–40.0)

## 2011-07-24 ENCOUNTER — Encounter: Payer: Self-pay | Admitting: Internal Medicine

## 2011-07-29 ENCOUNTER — Inpatient Hospital Stay: Payer: Self-pay | Admitting: Surgery

## 2011-07-29 LAB — URINALYSIS, COMPLETE
Bacteria: NONE SEEN
Bilirubin,UR: NEGATIVE
Glucose,UR: NEGATIVE mg/dL (ref 0–75)
Ketone: NEGATIVE
Nitrite: NEGATIVE
Protein: NEGATIVE
Transitional Epi: <1

## 2011-07-29 LAB — COMPREHENSIVE METABOLIC PANEL
Anion Gap: 12 (ref 7–16)
BUN: 13 mg/dL (ref 7–18)
Bilirubin,Total: 0.5 mg/dL (ref 0.2–1.0)
EGFR (African American): >60
EGFR (Non-African Amer.): >60
Potassium: 3.9 mmol/L (ref 3.5–5.1)
SGOT(AST): 36 U/L (ref 15–37)
SGPT (ALT): 36 U/L
Sodium: 137 mmol/L (ref 136–145)

## 2011-07-29 LAB — CBC
MCV: 101 fL — ABNORMAL HIGH (ref 80–100)
RBC: 2.97 10*6/uL — ABNORMAL LOW (ref 3.80–5.20)
WBC: 13.6 10*3/uL — ABNORMAL HIGH (ref 3.6–11.0)

## 2011-07-30 LAB — BASIC METABOLIC PANEL
Anion Gap: 11 (ref 7–16)
BUN: 12 mg/dL (ref 7–18)
Calcium, Total: 6.9 mg/dL — CL (ref 8.5–10.1)
Chloride: 112 mmol/L — ABNORMAL HIGH (ref 98–107)
Creatinine: 1.21 mg/dL (ref 0.60–1.30)
EGFR (African American): 58 — ABNORMAL LOW
EGFR (Non-African Amer.): 50 — ABNORMAL LOW
Osmolality: 281 (ref 275–301)
Potassium: 4.4 mmol/L (ref 3.5–5.1)
Sodium: 140 mmol/L (ref 136–145)

## 2011-07-30 LAB — CBC WITH DIFFERENTIAL/PLATELET
Basophil #: 0 10*3/uL (ref 0.0–0.1)
Basophil %: 0.2 %
Eosinophil %: 0 %
HGB: 8.2 g/dL — ABNORMAL LOW (ref 12.0–16.0)
Lymphocyte #: 2 10*3/uL (ref 1.0–3.6)
MCHC: 32.5 g/dL (ref 32.0–36.0)
MCV: 97 fL (ref 80–100)
Monocyte %: 7.7 %
Neutrophil #: 16.8 10*3/uL — ABNORMAL HIGH (ref 1.4–6.5)
RBC: 2.6 10*6/uL — ABNORMAL LOW (ref 3.80–5.20)

## 2011-07-31 LAB — BASIC METABOLIC PANEL
Anion Gap: 11 (ref 7–16)
Calcium, Total: 8 mg/dL — ABNORMAL LOW (ref 8.5–10.1)
Chloride: 109 mmol/L — ABNORMAL HIGH (ref 98–107)
Co2: 18 mmol/L — ABNORMAL LOW (ref 21–32)
Creatinine: 0.83 mg/dL (ref 0.60–1.30)
Glucose: 115 mg/dL — ABNORMAL HIGH (ref 65–99)
Potassium: 4.1 mmol/L (ref 3.5–5.1)

## 2011-07-31 LAB — CBC WITH DIFFERENTIAL/PLATELET
Basophil %: 0.1 %
HCT: 21 % — ABNORMAL LOW (ref 35.0–47.0)
HGB: 7 g/dL — ABNORMAL LOW (ref 12.0–16.0)
Lymphocyte #: 2.2 10*3/uL (ref 1.0–3.6)
MCH: 33 pg (ref 26.0–34.0)
MCV: 98 fL (ref 80–100)
Monocyte #: 1.3 x10 3/mm — ABNORMAL HIGH (ref 0.2–0.9)
Neutrophil #: 16.2 10*3/uL — ABNORMAL HIGH (ref 1.4–6.5)
Neutrophil %: 81.4 %
Platelet: 135 10*3/uL — ABNORMAL LOW (ref 150–440)
RDW: 16.2 % — ABNORMAL HIGH (ref 11.5–14.5)
WBC: 19.8 10*3/uL — ABNORMAL HIGH (ref 3.6–11.0)

## 2011-07-31 LAB — HEMOGLOBIN: HGB: 7.9 g/dL — ABNORMAL LOW (ref 12.0–16.0)

## 2011-08-01 LAB — CBC WITH DIFFERENTIAL/PLATELET
Basophil %: 0 %
Eosinophil #: 0 10*3/uL (ref 0.0–0.7)
Eosinophil %: 0 %
HCT: 23.5 % — ABNORMAL LOW (ref 35.0–47.0)
HGB: 7.9 g/dL — ABNORMAL LOW (ref 12.0–16.0)
Lymphocyte #: 0.5 10*3/uL — ABNORMAL LOW (ref 1.0–3.6)
Lymphocyte %: 2 %
Monocyte #: 1.1 x10 3/mm — ABNORMAL HIGH (ref 0.2–0.9)
Monocyte %: 4.3 %
Neutrophil #: 24 10*3/uL — ABNORMAL HIGH (ref 1.4–6.5)
Neutrophil %: 93.7 %
Platelet: 130 10*3/uL — ABNORMAL LOW (ref 150–440)
RBC: 2.5 10*6/uL — ABNORMAL LOW (ref 3.80–5.20)
RDW: 16.3 % — ABNORMAL HIGH (ref 11.5–14.5)
WBC: 25.6 10*3/uL — ABNORMAL HIGH (ref 3.6–11.0)

## 2011-08-01 LAB — BASIC METABOLIC PANEL
BUN: 10 mg/dL (ref 7–18)
Chloride: 110 mmol/L — ABNORMAL HIGH (ref 98–107)
Co2: 22 mmol/L (ref 21–32)
Creatinine: 0.75 mg/dL (ref 0.60–1.30)
EGFR (Non-African Amer.): >60
Glucose: 147 mg/dL — ABNORMAL HIGH (ref 65–99)
Osmolality: 281 (ref 275–301)
Potassium: 4.4 mmol/L (ref 3.5–5.1)
Sodium: 140 mmol/L (ref 136–145)

## 2011-08-01 LAB — CK TOTAL AND CKMB (NOT AT ARMC)
CK, Total: 712 U/L — ABNORMAL HIGH (ref 21–215)
CK-MB: 11.9 ng/mL — ABNORMAL HIGH (ref 0.5–3.6)

## 2011-08-02 LAB — BASIC METABOLIC PANEL
BUN: 13 mg/dL (ref 7–18)
Calcium, Total: 8.5 mg/dL (ref 8.5–10.1)
Chloride: 105 mmol/L (ref 98–107)
Creatinine: 0.77 mg/dL (ref 0.60–1.30)
EGFR (African American): >60
Glucose: 144 mg/dL — ABNORMAL HIGH (ref 65–99)
Potassium: 3.8 mmol/L (ref 3.5–5.1)
Sodium: 138 mmol/L (ref 136–145)

## 2011-08-02 LAB — CK TOTAL AND CKMB (NOT AT ARMC)
CK, Total: 378 U/L — ABNORMAL HIGH (ref 21–215)
CK-MB: 6.4 ng/mL — ABNORMAL HIGH (ref 0.5–3.6)
CK-MB: 3.6 ng/mL (ref 0.5–3.6)

## 2011-08-02 LAB — CBC WITH DIFFERENTIAL/PLATELET
Bands: 8 %
Comment - H1-Com4: NORMAL
HCT: 23.7 % — ABNORMAL LOW (ref 35.0–47.0)
HGB: 8 g/dL — ABNORMAL LOW (ref 12.0–16.0)
MCH: 31.5 pg (ref 26.0–34.0)
NRBC/100 WBC: 3 /
Platelet: 180 10*3/uL (ref 150–440)
RDW: 16.8 % — ABNORMAL HIGH (ref 11.5–14.5)
Segmented Neutrophils: 91 %
WBC: 32.3 10*3/uL — ABNORMAL HIGH (ref 3.6–11.0)

## 2011-08-02 LAB — PATHOLOGY REPORT

## 2011-08-02 LAB — TROPONIN I
Troponin-I: <0.02 ng/mL
Troponin-I: <0.02 ng/mL

## 2011-08-03 LAB — CBC WITH DIFFERENTIAL/PLATELET
HGB: 8.8 g/dL — ABNORMAL LOW (ref 12.0–16.0)
Lymphocytes: 1 %
MCH: 30.3 pg (ref 26.0–34.0)
MCV: 94 fL (ref 80–100)
Monocytes: 5 %
RBC: 2.89 10*6/uL — ABNORMAL LOW (ref 3.80–5.20)
RDW: 16.5 % — ABNORMAL HIGH (ref 11.5–14.5)
Segmented Neutrophils: 94 %
WBC: 32.9 10*3/uL — ABNORMAL HIGH (ref 3.6–11.0)

## 2011-08-03 LAB — BASIC METABOLIC PANEL
BUN: 14 mg/dL (ref 7–18)
Chloride: 104 mmol/L (ref 98–107)
Co2: 26 mmol/L (ref 21–32)
Creatinine: 0.94 mg/dL (ref 0.60–1.30)
EGFR (African American): >60
EGFR (Non-African Amer.): >60
Osmolality: 276 (ref 275–301)
Potassium: 3.8 mmol/L (ref 3.5–5.1)
Sodium: 137 mmol/L (ref 136–145)

## 2011-08-04 ENCOUNTER — Ambulatory Visit: Payer: BC Managed Care – PPO | Admitting: Internal Medicine

## 2011-08-04 LAB — CBC WITH DIFFERENTIAL/PLATELET
Bands: 2 %
HGB: 8.7 g/dL — ABNORMAL LOW (ref 12.0–16.0)
Lymphocytes: 9 %
MCH: 31.2 pg (ref 26.0–34.0)
Monocytes: 7 %
NRBC/100 WBC: 11 /
RBC: 2.79 10*6/uL — ABNORMAL LOW (ref 3.80–5.20)
RDW: 16.9 % — ABNORMAL HIGH (ref 11.5–14.5)

## 2011-08-04 LAB — AMYLASE, BODY FLUID: Amylase, Body Fluid: 19 U/L

## 2011-08-20 ENCOUNTER — Telehealth: Payer: Self-pay | Admitting: *Deleted

## 2011-08-20 NOTE — Telephone Encounter (Signed)
Received a call from patients sister Teresa Copeland wanting to let Dr. Dan Humphreys know that patient may or may not show up to her appt on Monday with Dr. Dan Humphreys.  She stated that patient has a history of abusing medication.  Patient received 60 Xanax tablets June 13th and Teresa Copeland stated that they are already gone.  A Rx for Oxycodone was filled on June 2nd and they are already gone as well.  She stated that patient is an alcoholic and they are really afraid that they may walk in and find her dead one day.  At times family members cannot get Teresa Copeland to answer the phone or come to the door and they have to go check on her.

## 2011-08-23 ENCOUNTER — Ambulatory Visit: Payer: BC Managed Care – PPO | Admitting: Internal Medicine

## 2011-08-25 ENCOUNTER — Ambulatory Visit: Payer: BC Managed Care – PPO | Admitting: Internal Medicine

## 2011-09-06 ENCOUNTER — Emergency Department: Payer: Self-pay | Admitting: Emergency Medicine

## 2011-09-07 LAB — TSH: Thyroid Stimulating Horm: 0.42 u[IU]/mL — ABNORMAL LOW

## 2011-09-07 LAB — COMPREHENSIVE METABOLIC PANEL
Anion Gap: 14 (ref 7–16)
BUN: 9 mg/dL (ref 7–18)
Calcium, Total: 8.5 mg/dL (ref 8.5–10.1)
Chloride: 104 mmol/L (ref 98–107)
Co2: 20 mmol/L — ABNORMAL LOW (ref 21–32)
Creatinine: 0.91 mg/dL (ref 0.60–1.30)
EGFR (Non-African Amer.): >60
Glucose: 92 mg/dL (ref 65–99)
Osmolality: 274 (ref 275–301)
Potassium: 3 mmol/L — ABNORMAL LOW (ref 3.5–5.1)
Sodium: 138 mmol/L (ref 136–145)
Total Protein: 6.8 g/dL (ref 6.4–8.2)

## 2011-09-07 LAB — URINALYSIS, COMPLETE
Glucose,UR: NEGATIVE mg/dL (ref 0–75)
Ketone: NEGATIVE
Nitrite: NEGATIVE
Specific Gravity: 1.003 (ref 1.003–1.030)
WBC UR: 6 /HPF (ref 0–5)

## 2011-09-07 LAB — DRUG SCREEN, URINE
Amphetamines, Ur Screen: NEGATIVE (ref ?–1000)
Benzodiazepine, Ur Scrn: POSITIVE (ref ?–200)
Cannabinoid 50 Ng, Ur ~~LOC~~: NEGATIVE (ref ?–50)
MDMA (Ecstasy)Ur Screen: NEGATIVE (ref ?–500)
Phencyclidine (PCP) Ur S: NEGATIVE (ref ?–25)
Tricyclic, Ur Screen: NEGATIVE (ref ?–1000)

## 2011-09-07 LAB — CBC
HCT: 30.1 % — ABNORMAL LOW (ref 35.0–47.0)
MCH: 30.2 pg (ref 26.0–34.0)
MCHC: 32 g/dL (ref 32.0–36.0)
Platelet: 734 10*3/uL — ABNORMAL HIGH (ref 150–440)

## 2011-09-07 LAB — ETHANOL: Ethanol %: 0.044 % (ref 0.000–0.080)

## 2011-09-13 ENCOUNTER — Ambulatory Visit: Payer: Self-pay | Admitting: Surgery

## 2011-09-21 ENCOUNTER — Ambulatory Visit (INDEPENDENT_AMBULATORY_CARE_PROVIDER_SITE_OTHER): Payer: BC Managed Care – PPO | Admitting: Internal Medicine

## 2011-09-21 ENCOUNTER — Encounter: Payer: Self-pay | Admitting: Internal Medicine

## 2011-09-21 VITALS — BP 122/70 | HR 60 | Temp 98.7°F | Ht 68.0 in | Wt 155.5 lb

## 2011-09-21 DIAGNOSIS — F1010 Alcohol abuse, uncomplicated: Secondary | ICD-10-CM | POA: Insufficient documentation

## 2011-09-21 DIAGNOSIS — G8929 Other chronic pain: Secondary | ICD-10-CM

## 2011-09-21 DIAGNOSIS — I10 Essential (primary) hypertension: Secondary | ICD-10-CM

## 2011-09-21 DIAGNOSIS — Z9181 History of falling: Secondary | ICD-10-CM

## 2011-09-21 DIAGNOSIS — Z23 Encounter for immunization: Secondary | ICD-10-CM

## 2011-09-21 DIAGNOSIS — Z7289 Other problems related to lifestyle: Secondary | ICD-10-CM

## 2011-09-21 DIAGNOSIS — R296 Repeated falls: Secondary | ICD-10-CM

## 2011-09-21 DIAGNOSIS — Z9081 Acquired absence of spleen: Secondary | ICD-10-CM | POA: Insufficient documentation

## 2011-09-21 DIAGNOSIS — Z9089 Acquired absence of other organs: Secondary | ICD-10-CM

## 2011-09-21 LAB — CBC WITH DIFFERENTIAL/PLATELET
Basophils Relative: 0.8 % (ref 0.0–3.0)
Eosinophils Absolute: 0.2 10*3/uL (ref 0.0–0.7)
Eosinophils Relative: 2 % (ref 0.0–5.0)
Hemoglobin: 10.6 g/dL — ABNORMAL LOW (ref 12.0–15.0)
Lymphocytes Relative: 29.1 % (ref 12.0–46.0)
MCHC: 32.2 g/dL (ref 30.0–36.0)
Neutro Abs: 7.1 10*3/uL (ref 1.4–7.7)
RBC: 3.35 Mil/uL — ABNORMAL LOW (ref 3.87–5.11)
WBC: 12.4 10*3/uL — ABNORMAL HIGH (ref 4.5–10.5)

## 2011-09-21 LAB — COMPREHENSIVE METABOLIC PANEL
AST: 35 U/L (ref 0–37)
Albumin: 3.1 g/dL — ABNORMAL LOW (ref 3.5–5.2)
BUN: 9 mg/dL (ref 6–23)
CO2: 23 mEq/L (ref 19–32)
Calcium: 8.9 mg/dL (ref 8.4–10.5)
Chloride: 101 mEq/L (ref 96–112)
Creatinine, Ser: 0.9 mg/dL (ref 0.4–1.2)
GFR: 71.61 mL/min (ref >60.00)
Glucose, Bld: 95 mg/dL (ref 70–99)
Potassium: 4.5 mEq/L (ref 3.5–5.1)

## 2011-09-21 LAB — VITAMIN B12: Vitamin B-12: 525 pg/mL (ref 211–911)

## 2011-09-21 LAB — TSH: TSH: 0.44 u[IU]/mL (ref 0.35–5.50)

## 2011-09-21 NOTE — Assessment & Plan Note (Signed)
Patient admits to only occasional use of alcohol. However family reports daily use. Encouraged her to consider limiting alcohol use, particularly given recurrent episodes of falls and recent splenectomy.

## 2011-09-21 NOTE — Patient Instructions (Signed)
Splenectomy, Long-Term Care After A splenectomy is the surgical removal of a diseased or injured spleen. The most common reasons for the spleen to be removed are because of severe injury (trauma), sickle cell disease, or a condition that causes blood clotting problems (idiopathic thrombocytopenic purpura, ITP). The spleen is an organ located in the upper abdomen under your left ribs. It is a sponge-like organ, about the size of an orange, which acts as a filter. The spleen removes waste from the blood, maintains cells that make antibodies to help fight infection, and stores other blood cells. The spleen, along with other body systems and organs, plays an important role in the body's natural defense system (immune system). Once it is removed, there is a slightly greater chance of developing a serious, life-threatening infection (overwhelming postsplenectomy sepsis). It is important to take extra steps to prevent infection. Other precautions may be necessary to prevent blood clots from forming. PREVENTING INFECTION Your caregiver will recommend important steps to help prevent infection. These may include:  Making sure your immunizations are up to date, including:   Pneumococcus.   Seasonal flu (influenza).   Haemophilus influenzae type b (Hib).   Meningitis.   Making sure family members' vaccines are up to date.   In some cases, antibiotics may be needed if you get symptoms of infection. Not all patients need this type of treatment after a splenectomy. Talk to your caregiver about what is best for you if these symptoms develop.   Following good daily practices to prevent infection, such as:   Washing hands often, especially after preparing food, eating, changing diapers, and playing with children or animals.   Disinfecting surfaces regularly.   Avoiding others with active illness or infections.   Taking precautions to avoid mosquito and tick bites, such as:   Wearing proper clothing that  covers the entire body when you are in wooded or marshy areas.   Changing clothing right away and checking for bites after you have been outside.   Using insect spray.   Using insect netting.   Staying indoors during peak mosquito hours.  PREVENTING BLOOD CLOTS Splenectomy may increase your risk of forming a blood clot. This is of utmost concern immediately after surgery. However, it may continue as a lifelong risk. To help prevent blood clots, your caregiver may recommend:  Exercising as directed. Find a safe, regular exercise program that works well for you.   Getting up, stretching, and moving around every hour if you sit a lot or do not move about much at work or during travel time.   Taking medicines (such as aspirin) as directed.  TRAVEL If you travel in the [place] take care to avoid tick bites, especially in the [place] coastal areas. Ticks can spread the parasite that causes babesiosis. Babesiosis is a flu-like illness that is treatable. If you travel abroad where malaria is common, follow these guidelines:  Contact your caregiver and discuss the places you are visiting for specific advice.   Make sure all of your immunizations are up to date.   Get specific immunizations to guard against the disease risk in the country you are visiting.   Understand how to prevent infections, such as malaria, abroad. These infections can pose serious risk. Precautions may include:   Daily tablets to prevent malaria.   Using mosquito nets.   Using insect spray.   Bring your broad-spectrum, full-strength antibiotics with you.  HOME CARE INSTRUCTIONS   Take all medicines as directed. If you are  prescribed antibiotics, discuss with your caregiver the use of a probiotic supplement to prevent stomach upset.   Keep track of medicine refills so that you do not run out of medicine.   Inform your close contacts of your condition. Consider wearing a medical alert bracelet or carrying an ID card.    See your caregiver for vaccinations, follow-up exams, and testing as directed.   Follow all your caregiver's instructions on managing this and other conditions.  SEEK MEDICAL CARE IF:   You have an oral temperature above 102 F (38.9 C).   You develop signs of infection, such as chills and feeling unwell, that continue after taking the full-strength antibiotic.   You are considering travel abroad.   You have questions or concerns.  SEEK IMMEDIATE MEDICAL CARE IF:   You have chest pain along with:   Shortness of breath.   Pain in the back, neck, or jaw.   You have pain or swelling in the leg.   You develop a sudden headache and dizziness.  MAKE SURE YOU:   Understand these instructions.   Will watch your condition.   Will get help right away if you are not doing well or get worse.  Document Released: 07/22/2009 Document Revised: 01/21/2011 Document Reviewed: 07/22/2009 [facility] Patient Information 2012 [place], [place].

## 2011-09-21 NOTE — Progress Notes (Signed)
Subjective:    Patient ID: Teresa Copeland, female    DOB: 1955/09/28, 56 y.o.   MRN: [id number]  HPI 56 year old female with history of chronic pain in her left shoulder and back presents for followup. In the interim since her last visit, she reports that she fell in her bathroom and ultimately had a traumatic injury to her spleen which required emergency splenectomy. She reports she has been feeling well since the surgery. She notes some ongoing fatigue. She denies any abdominal pain.  In regards to chronic left shoulder pain, she reports that pain is persistent and she needs new prescription for hydrocodone. Review of her records from the pharmacy today show that hydrocodone was filled on 09/10/2011 and oxycodone was filled earlier in July. Patient's family had called our clinic in early July expressing some concern about patient's overuse of prescription narcotics and misuse of alcohol. Patient admits to an occasional drink but reports she does not drink on a regular basis. She denies overuse of narcotics. She does note frequent falls.  Outpatient Encounter Prescriptions as of 09/21/2011  Medication Sig Dispense Refill  . albuterol (PROVENTIL HFA;VENTOLIN HFA) 108 (90 BASE) MCG/ACT inhaler Inhale 2 puffs into the lungs every 6 (six) hours as needed.        Marland Kitchen albuterol (PROVENTIL) (2.5 MG/3ML) 0.083% nebulizer solution Take 3 mLs (2.5 mg total) by nebulization every 6 (six) hours as needed.  75 mL  3  . atorvastatin (LIPITOR) 20 MG tablet Take 1 tablet (20 mg total) by mouth daily.  90 tablet  3  . eszopiclone (LUNESTA) 2 MG TABS Take 1 tablet (2 mg total) by mouth at bedtime. Take immediately before bedtime  30 tablet  0  . Fluticasone-Salmeterol (ADVAIR) 500-50 MCG/DOSE AEPB Inhale 1 puff into the lungs every 12 (twelve) hours.  180 each  1  . lisinopril-hydrochlorothiazide (PRINZIDE,ZESTORETIC) 10-12.5 MG per tablet Take 1 tablet by mouth 2 (two) times daily.  180 tablet  1  . valACYclovir (VALTREX)  500 MG tablet       . ALPRAZolam (XANAX) 1 MG tablet       . DISCONTD: gentamicin ointment (GARAMYCIN) 0.1 % Apply topically 3 (three) times daily.  15 g  0    Review of Systems  Constitutional: Positive for fatigue. Negative for fever, chills, appetite change and unexpected weight change.  HENT: Negative for ear pain, congestion, sore throat, trouble swallowing, neck pain, voice change and sinus pressure.   Eyes: Negative for visual disturbance.  Respiratory: Negative for cough, shortness of breath, wheezing and stridor.   Cardiovascular: Negative for chest pain, palpitations and leg swelling.  Gastrointestinal: Negative for nausea, vomiting, abdominal pain, diarrhea, constipation, blood in stool, abdominal distention and anal bleeding.  Genitourinary: Negative for dysuria and flank pain.  Musculoskeletal: Positive for myalgias and arthralgias. Negative for gait problem.  Skin: Negative for color change and rash.  Neurological: Negative for dizziness and headaches.  Hematological: Negative for adenopathy. Does not bruise/bleed easily.  Psychiatric/Behavioral: Negative for suicidal ideas, disturbed wake/sleep cycle and dysphoric mood. The patient is not nervous/anxious.    BP 122/70  Pulse 60  Temp 98.7 F (37.1 C) (Oral)  Ht 5\' 8"  (1.727 m)  Wt 155 lb 8 oz (70.534 kg)  BMI 23.64 kg/m2  SpO2 97%     Objective:   Physical Exam  Constitutional: She is oriented to person, place, and time. She appears well-developed and well-nourished. No distress.  HENT:  Head: Normocephalic and atraumatic.  Right Ear: External  ear normal.  Left Ear: External ear normal.  Nose: Nose normal.  Mouth/Throat: Oropharynx is clear and moist. No oropharyngeal exudate.  Eyes: Conjunctivae are normal. Pupils are equal, round, and reactive to light. Right eye exhibits no discharge. Left eye exhibits no discharge. No scleral icterus.  Neck: Normal range of motion. Neck supple. No tracheal deviation present.  No thyromegaly present.  Cardiovascular: Normal rate, regular rhythm, normal heart sounds and intact distal pulses.  Exam reveals no gallop and no friction rub.   No murmur heard. Pulmonary/Chest: Effort normal and breath sounds normal. No respiratory distress. She has no wheezes. She has no rales. She exhibits no tenderness.  Abdominal: Soft. Bowel sounds are normal. She exhibits no distension and no mass. There is no tenderness. There is no guarding.    Musculoskeletal: Normal range of motion. She exhibits no edema and no tenderness.  Lymphadenopathy:    She has no cervical adenopathy.  Neurological: She is alert and oriented to person, place, and time. No cranial nerve deficit. She exhibits normal muscle tone. Coordination normal.  Skin: Skin is warm and dry. No rash noted. She is not diaphoretic. No erythema. No pallor.  Psychiatric: She has a normal mood and affect. Her behavior is normal. Judgment and thought content normal.          Assessment & Plan:

## 2011-09-21 NOTE — Assessment & Plan Note (Signed)
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 Recovering well. Patient is up-to-date on immunizations including Pneumovax and tetanus. Will give a meningococcal vaccine today. Will check CBC and CMP with labs.

## 2011-09-21 NOTE — Assessment & Plan Note (Signed)
Patient with chronic pain in her left shoulder after injury at work.she requests her chronic prescription today. However, review from her pharmacy shows she has felt narcotics, including hydrocodone on 09/10/2011 and oxycodone earlier in the month. Given her family's concern about narcotic abuse and early requests for medication, we will no longer be able to refill prescription narcotics for her. Referral to pain clinic was placed. Will send urine drug screen today.

## 2011-09-21 NOTE — Assessment & Plan Note (Signed)
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 As noted no further narcotic prescriptions from our office. Encouraged her to limit intake alcohol use. Will send labs today including CBC, CMP, TSH, B12.

## 2011-09-22 LAB — DRUG SCREEN, URINE
Barbiturate Quant, Ur: NEGATIVE
Benzodiazepines.: POSITIVE — AB
Creatinine,U: 189.02 mg/dL
Methadone: NEGATIVE
Propoxyphene: NEGATIVE

## 2011-09-23 ENCOUNTER — Ambulatory Visit: Payer: BC Managed Care – PPO | Admitting: Internal Medicine

## 2011-09-27 ENCOUNTER — Telehealth: Payer: Self-pay | Admitting: *Deleted

## 2011-09-27 NOTE — Telephone Encounter (Signed)
Patient advised as instructed via telephone. 

## 2011-09-27 NOTE — Telephone Encounter (Signed)
We need to add a ferritin onto her labs to see if anemia is from iron deficiency.

## 2011-09-27 NOTE — Telephone Encounter (Signed)
Patient called and wanted to know if she is getting enough iron in the centrum silver she takes daily or should she be taking an iron supplement as well?  She stated that she is cold all of the time and wanted to know if it was because of her anemia?  Please advise.

## 2011-09-28 ENCOUNTER — Other Ambulatory Visit (INDEPENDENT_AMBULATORY_CARE_PROVIDER_SITE_OTHER): Payer: BC Managed Care – PPO | Admitting: *Deleted

## 2011-09-28 DIAGNOSIS — D649 Anemia, unspecified: Secondary | ICD-10-CM

## 2011-09-28 LAB — CBC WITH DIFFERENTIAL/PLATELET
Basophils Relative: 3.1 % — ABNORMAL HIGH (ref 0.0–3.0)
Eosinophils Absolute: 0.1 10*3/uL (ref 0.0–0.7)
Eosinophils Relative: 1.3 % (ref 0.0–5.0)
Lymphocytes Relative: 34.5 % (ref 12.0–46.0)
MCV: 99.5 fl (ref 78.0–100.0)
Monocytes Absolute: 0.8 10*3/uL (ref 0.1–1.0)
Neutrophils Relative %: 51.1 % (ref 43.0–77.0)
Platelets: 329 10*3/uL (ref 150.0–400.0)
RBC: 3.57 Mil/uL — ABNORMAL LOW (ref 3.87–5.11)
WBC: 8 10*3/uL (ref 4.5–10.5)

## 2011-09-28 LAB — COMPREHENSIVE METABOLIC PANEL
AST: 36 U/L (ref 0–37)
Albumin: 3.4 g/dL — ABNORMAL LOW (ref 3.5–5.2)
Alkaline Phosphatase: 182 U/L — ABNORMAL HIGH (ref 39–117)
BUN: 9 mg/dL (ref 6–23)
Calcium: 8.7 mg/dL (ref 8.4–10.5)
Chloride: 100 mEq/L (ref 96–112)
Glucose, Bld: 89 mg/dL (ref 70–99)
Potassium: 3.6 mEq/L (ref 3.5–5.1)
Sodium: 132 mEq/L — ABNORMAL LOW (ref 135–145)
Total Protein: 6.8 g/dL (ref 6.0–8.3)

## 2011-10-08 ENCOUNTER — Other Ambulatory Visit: Payer: Self-pay | Admitting: Internal Medicine

## 2011-10-27 ENCOUNTER — Ambulatory Visit (INDEPENDENT_AMBULATORY_CARE_PROVIDER_SITE_OTHER): Payer: BC Managed Care – PPO | Admitting: Internal Medicine

## 2011-10-27 ENCOUNTER — Encounter: Payer: Self-pay | Admitting: Internal Medicine

## 2011-10-27 VITALS — BP 130/90 | HR 100 | Temp 98.2°F | Ht 68.0 in | Wt 155.5 lb

## 2011-10-27 DIAGNOSIS — Z9081 Acquired absence of spleen: Secondary | ICD-10-CM

## 2011-10-27 DIAGNOSIS — M109 Gout, unspecified: Secondary | ICD-10-CM

## 2011-10-27 DIAGNOSIS — M79609 Pain in unspecified limb: Secondary | ICD-10-CM

## 2011-10-27 DIAGNOSIS — Z9089 Acquired absence of other organs: Secondary | ICD-10-CM

## 2011-10-27 DIAGNOSIS — G8929 Other chronic pain: Secondary | ICD-10-CM

## 2011-10-27 DIAGNOSIS — M79602 Pain in left arm: Secondary | ICD-10-CM

## 2011-10-27 DIAGNOSIS — F419 Anxiety disorder, unspecified: Secondary | ICD-10-CM

## 2011-10-27 DIAGNOSIS — F411 Generalized anxiety disorder: Secondary | ICD-10-CM

## 2011-10-27 LAB — CBC WITH DIFFERENTIAL/PLATELET
Basophils Relative: 0.8 % (ref 0.0–3.0)
Eosinophils Relative: 4.4 % (ref 0.0–5.0)
Hemoglobin: 12.6 g/dL (ref 12.0–15.0)
Lymphocytes Relative: 48.1 % — ABNORMAL HIGH (ref 12.0–46.0)
Monocytes Relative: 10.7 % (ref 3.0–12.0)
Neutro Abs: 2.3 10*3/uL (ref 1.4–7.7)
Neutrophils Relative %: 36 % — ABNORMAL LOW (ref 43.0–77.0)
RBC: 3.86 Mil/uL — ABNORMAL LOW (ref 3.87–5.11)
WBC: 6.5 10*3/uL (ref 4.5–10.5)

## 2011-10-27 MED ORDER — ALPRAZOLAM 1 MG PO TABS: 1.0000 mg | ORAL_TABLET | Freq: Every evening | ORAL | Status: DC | PRN

## 2011-10-27 NOTE — Assessment & Plan Note (Signed)
Anxiety well controlled with Xanax. We'll continue.

## 2011-10-27 NOTE — Progress Notes (Signed)
Subjective:    Patient ID: Teresa Copeland, female    DOB: 1955-07-03, 56 y.o.   MRN: [id number]  HPI 56 year old female with history of anxiety, alcohol use, hypertension, and chronic pain presents for followup. She reports that her pain in her left shoulder is persistent. She reports she was seen by orthopedic surgeon through [facility] and decision was made not to proceed with surgical intervention at this time. She reports ongoing pain in her left shoulder but is not currently taking any prescription medications for this.  She reports ongoing fatigue since her splenectomy. She denies any fever or chills. She denies any focal symptoms such as chest pain, shortness of breath, change in bowel habits.  In regards to anxiety, she reports symptoms are well controlled with the use of Xanax.  Outpatient Encounter Prescriptions as of 10/27/2011  Medication Sig Dispense Refill  . albuterol (PROVENTIL HFA;VENTOLIN HFA) 108 (90 BASE) MCG/ACT inhaler Inhale 2 puffs into the lungs every 6 (six) hours as needed.        Marland Kitchen albuterol (PROVENTIL) (2.5 MG/3ML) 0.083% nebulizer solution Take 3 mLs (2.5 mg total) by nebulization every 6 (six) hours as needed.  75 mL  3  . ALPRAZolam (XANAX) 1 MG tablet Take 1 tablet (1 mg total) by mouth at bedtime as needed for sleep.  30 tablet  3  . atorvastatin (LIPITOR) 20 MG tablet Take 1 tablet (20 mg total) by mouth daily.  90 tablet  3  . eszopiclone (LUNESTA) 2 MG TABS Take 1 tablet (2 mg total) by mouth at bedtime. Take immediately before bedtime  30 tablet  0  . Fluticasone-Salmeterol (ADVAIR) 500-50 MCG/DOSE AEPB Inhale 1 puff into the lungs every 12 (twelve) hours.  180 each  1  . ibuprofen (ADVIL,MOTRIN) 800 MG tablet TAKE 1 TABLET EVERY 8 HOURS AS NEEDED FOR PAIN  90 tablet  3  . lisinopril-hydrochlorothiazide (PRINZIDE,ZESTORETIC) 10-12.5 MG per tablet Take 1 tablet by mouth 2 (two) times daily.  180 tablet  1  . valACYclovir (VALTREX) 500 MG tablet       .  DISCONTD: ALPRAZolam (XANAX) 1 MG tablet        BP 130/90  Pulse 100  Temp 98.2 F (36.8 C) (Oral)  Ht 5\' 8"  (1.727 m)  Wt 155 lb 8 oz (70.534 kg)  BMI 23.64 kg/m2  SpO2 90%  Review of Systems  Constitutional: Positive for fatigue. Negative for fever, chills, appetite change and unexpected weight change.  HENT: Negative for ear pain, congestion, sore throat, trouble swallowing, neck pain, voice change and sinus pressure.   Eyes: Negative for visual disturbance.  Respiratory: Negative for cough, shortness of breath, wheezing and stridor.   Cardiovascular: Negative for chest pain, palpitations and leg swelling.  Gastrointestinal: Negative for nausea, vomiting, abdominal pain, diarrhea, constipation, blood in stool, abdominal distention and anal bleeding.  Genitourinary: Negative for dysuria and flank pain.  Musculoskeletal: Positive for myalgias, back pain and arthralgias. Negative for gait problem.  Skin: Negative for color change and rash.  Neurological: Negative for dizziness and headaches.  Hematological: Negative for adenopathy. Does not bruise/bleed easily.  Psychiatric/Behavioral: Negative for suicidal ideas, disturbed wake/sleep cycle and dysphoric mood. The patient is nervous/anxious.        Objective:   Physical Exam  Constitutional: She is oriented to person, place, and time. She appears well-developed and well-nourished. No distress.  HENT:  Head: Normocephalic and atraumatic.  Right Ear: External ear normal.  Left Ear: External ear normal.  Nose:  Nose normal.  Mouth/Throat: Oropharynx is clear and moist. No oropharyngeal exudate.  Eyes: Conjunctivae normal are normal. Pupils are equal, round, and reactive to light. Right eye exhibits no discharge. Left eye exhibits no discharge. No scleral icterus.  Neck: Normal range of motion. Neck supple. No tracheal deviation present. No thyromegaly present.  Cardiovascular: Normal rate, regular rhythm, normal heart sounds and  intact distal pulses.  Exam reveals no gallop and no friction rub.   No murmur heard. Pulmonary/Chest: Effort normal and breath sounds normal. No respiratory distress. She has no wheezes. She has no rales. She exhibits no tenderness.  Abdominal: Soft. Bowel sounds are normal. She exhibits no distension and no mass. There is no tenderness. There is no guarding.    Musculoskeletal: She exhibits no edema and no tenderness.       Left shoulder: She exhibits decreased range of motion, tenderness and pain.  Lymphadenopathy:    She has no cervical adenopathy.  Neurological: She is alert and oriented to person, place, and time. No cranial nerve deficit. She exhibits normal muscle tone. Coordination normal.  Skin: Skin is warm and dry. No rash noted. She is not diaphoretic. No erythema. No pallor.  Psychiatric: She has a normal mood and affect. Her behavior is normal. Judgment and thought content normal.          Assessment & Plan:

## 2011-10-27 NOTE — Assessment & Plan Note (Signed)
Patient reports that she was seen by orthopedics and decision made not to proceed with surgery at this time. She is not currently taking any medications for pain. Note no narcotics from our clinic given ongoing issues with alcohol abuse.

## 2011-10-27 NOTE — Assessment & Plan Note (Signed)
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 Patient is up-to-date on vaccinations except for flu shot which is given today. Again discussed the importance of calling if she develops fever. Will recheck CBC with labs today.

## 2011-10-28 LAB — COMPREHENSIVE METABOLIC PANEL
AST: 29 U/L (ref 0–37)
Albumin: 4 g/dL (ref 3.5–5.2)
Alkaline Phosphatase: 127 U/L — ABNORMAL HIGH (ref 39–117)
BUN: 20 mg/dL (ref 6–23)
Calcium: 9.4 mg/dL (ref 8.4–10.5)
Chloride: 106 mEq/L (ref 96–112)
Creatinine, Ser: 1.2 mg/dL (ref 0.4–1.2)
Glucose, Bld: 71 mg/dL (ref 70–99)
Potassium: 2.8 mEq/L — CL (ref 3.5–5.1)

## 2011-11-05 ENCOUNTER — Telehealth: Payer: Self-pay | Admitting: *Deleted

## 2011-11-05 NOTE — Telephone Encounter (Signed)
Left message on cell phone voicemail for patient to return call. 

## 2011-11-10 MED ORDER — POTASSIUM CHLORIDE CRYS ER 20 MEQ PO TBCR: 20.0000 meq | EXTENDED_RELEASE_TABLET | Freq: Every day | ORAL | Status: DC

## 2011-11-10 NOTE — Telephone Encounter (Signed)
Patient advised as instructed via telephone of lab results, Rx for Klor-con sent to [facility] pharmacy.  Patient will call back to schedule lab appt for Monday and one month follow up.

## 2011-11-10 NOTE — Telephone Encounter (Signed)
Left message on cell phone voicemail for patient to return call. 

## 2011-11-10 NOTE — Addendum Note (Signed)
Addended by: Gilmer Mor on: 11/10/2011 02:39 PM   Modules accepted: Orders

## 2011-11-15 ENCOUNTER — Telehealth: Payer: Self-pay | Admitting: Internal Medicine

## 2011-11-15 NOTE — Telephone Encounter (Signed)
She was supposed to take it for 3 days, then recheck potassium level. She needs a repeat CMP checked.

## 2011-11-15 NOTE — Telephone Encounter (Signed)
Dir: take 1 tablet (20 MEQ Total) by mouth daily. Pharmacy notes: patient is to take one tablet on Friday, Saturday and Sunday

## 2011-11-15 NOTE — Telephone Encounter (Signed)
Pot chlor er (klor0con m20) tb stremth 20 meq qty 3 Please clarify the directions

## 2011-11-16 ENCOUNTER — Other Ambulatory Visit (INDEPENDENT_AMBULATORY_CARE_PROVIDER_SITE_OTHER): Payer: BC Managed Care – PPO

## 2011-11-16 DIAGNOSIS — M109 Gout, unspecified: Secondary | ICD-10-CM

## 2011-11-16 DIAGNOSIS — G8929 Other chronic pain: Secondary | ICD-10-CM

## 2011-11-16 DIAGNOSIS — Z9089 Acquired absence of other organs: Secondary | ICD-10-CM

## 2011-11-16 LAB — COMPREHENSIVE METABOLIC PANEL
ALT: 24 U/L (ref 0–35)
CO2: 13 mEq/L — ABNORMAL LOW (ref 19–32)
Calcium: 9.5 mg/dL (ref 8.4–10.5)
Chloride: 104 mEq/L (ref 96–112)
GFR: 31.13 mL/min — ABNORMAL LOW (ref >60.00)
Potassium: 3.7 mEq/L (ref 3.5–5.1)
Sodium: 132 mEq/L — ABNORMAL LOW (ref 135–145)
Total Bilirubin: 0.4 mg/dL (ref 0.3–1.2)
Total Protein: 7.3 g/dL (ref 6.0–8.3)

## 2011-11-16 LAB — CBC WITH DIFFERENTIAL/PLATELET
Basophils Absolute: 0 10*3/uL (ref 0.0–0.1)
HCT: 38.7 % (ref 36.0–46.0)
Lymphs Abs: 0.6 10*3/uL — ABNORMAL LOW (ref 0.7–4.0)
Monocytes Absolute: 0.4 10*3/uL (ref 0.1–1.0)
Monocytes Relative: 6.3 % (ref 3.0–12.0)
Platelets: 253 10*3/uL (ref 150.0–400.0)
RDW: 16.4 % — ABNORMAL HIGH (ref 11.5–14.6)

## 2011-11-16 LAB — URIC ACID: Uric Acid, Serum: 13.1 mg/dL — ABNORMAL HIGH (ref 2.4–7.0)

## 2011-11-17 NOTE — Telephone Encounter (Signed)
This Rx was sent to [facility] by mistake, Rx resent to local pharmacy.

## 2011-11-18 ENCOUNTER — Encounter: Payer: Self-pay | Admitting: Internal Medicine

## 2011-11-18 ENCOUNTER — Ambulatory Visit (INDEPENDENT_AMBULATORY_CARE_PROVIDER_SITE_OTHER): Payer: BC Managed Care – PPO | Admitting: Internal Medicine

## 2011-11-18 VITALS — BP 140/90 | HR 80 | Temp 98.0°F | Ht 68.0 in | Wt 155.0 lb

## 2011-11-18 DIAGNOSIS — R739 Hyperglycemia, unspecified: Secondary | ICD-10-CM

## 2011-11-18 DIAGNOSIS — R634 Abnormal weight loss: Secondary | ICD-10-CM

## 2011-11-18 DIAGNOSIS — N179 Acute kidney failure, unspecified: Secondary | ICD-10-CM | POA: Insufficient documentation

## 2011-11-18 DIAGNOSIS — F419 Anxiety disorder, unspecified: Secondary | ICD-10-CM

## 2011-11-18 DIAGNOSIS — B370 Candidal stomatitis: Secondary | ICD-10-CM | POA: Insufficient documentation

## 2011-11-18 DIAGNOSIS — R7309 Other abnormal glucose: Secondary | ICD-10-CM

## 2011-11-18 DIAGNOSIS — F411 Generalized anxiety disorder: Secondary | ICD-10-CM

## 2011-11-18 LAB — COMPREHENSIVE METABOLIC PANEL
ALT: 27 U/L (ref 0–35)
AST: 33 U/L (ref 0–37)
Alkaline Phosphatase: 96 U/L (ref 39–117)
Chloride: 104 mEq/L (ref 96–112)
Creatinine, Ser: 2.4 mg/dL — ABNORMAL HIGH (ref 0.4–1.2)
Total Bilirubin: 0.5 mg/dL (ref 0.3–1.2)

## 2011-11-18 MED ORDER — FLUCONAZOLE 150 MG PO TABS: 150.0000 mg | ORAL_TABLET | Freq: Every day | ORAL | Status: DC

## 2011-11-18 MED ORDER — ALPRAZOLAM 1 MG PO TABS: 1.0000 mg | ORAL_TABLET | Freq: Three times a day (TID) | ORAL | Status: DC | PRN

## 2011-11-18 NOTE — Assessment & Plan Note (Signed)
Patient with unintentional weight loss and hyperuricemia. Concerning for malignancy. As above, will get ultrasound of the abdomen to evaluate the pancreas.

## 2011-11-18 NOTE — Assessment & Plan Note (Signed)
Patient noted to have significant decline in renal function over the last month. Repeat labs today showed GFR of 22. Asked patient to stop taking both lisinopril hydrochlorothiazide and ibuprofen. Question if ibuprofen use may have led to acute renal failure. Will set up nephrology evaluation with renal ultrasound.

## 2011-11-18 NOTE — Progress Notes (Signed)
Subjective:    Patient ID: Teresa Copeland, female    DOB: 1955/11/28, 56 y.o.   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 Recent labs show marked decline in kidney function over the last month from GFR greater than 60 one month ago to GFR of 22 today. Patient denies any recent change in medication. She has been taking her lisinopril hydrochlorothiazide and ibuprofen for pain. She denies any new supplemental medications. She denies any recent use of alcohol. She denies any recent illnesses or change in oral intake. Recent lab work also showed marked elevation of uric acid and blood sugar. She has no known history of diabetes. She reports that she is generally been feeling well. She notes that she has unintentionally lost approximately 20 pounds over the last 5 months. However, she denies any nausea, vomiting, diarrhea. She does note some increased anxiety for which she uses Xanax with improvement. She is also concerned today about some thrush in her mouth which she attributes to use of Advair.  Outpatient Encounter Prescriptions as of 11/18/2011  Medication Sig Dispense Refill  . albuterol (PROVENTIL HFA;VENTOLIN HFA) 108 (90 BASE) MCG/ACT inhaler Inhale 2 puffs into the lungs every 6 (six) hours as needed.        Marland Kitchen albuterol (PROVENTIL) (2.5 MG/3ML) 0.083% nebulizer solution Take 3 mLs (2.5 mg total) by nebulization every 6 (six) hours as needed.  75 mL  3  . ALPRAZolam (XANAX) 1 MG tablet Take 1 tablet (1 mg total) by mouth 3 (three) times daily as needed for sleep or anxiety.  90 tablet  3  . atorvastatin (LIPITOR) 20 MG tablet Take 1 tablet (20 mg total) by mouth daily.  90 tablet  3  . eszopiclone (LUNESTA) 2 MG TABS Take 1 tablet (2 mg total) by mouth at bedtime. Take immediately before bedtime  30 tablet  0  .  Fluticasone-Salmeterol (ADVAIR) 500-50 MCG/DOSE AEPB Inhale 1 puff into the lungs every 12 (twelve) hours.  180 each  1  . ibuprofen (ADVIL,MOTRIN) 800 MG tablet TAKE 1 TABLET EVERY 8 HOURS AS NEEDED FOR PAIN  90 tablet  3  . lisinopril-hydrochlorothiazide (PRINZIDE,ZESTORETIC) 10-12.5 MG per tablet Take 1 tablet by mouth 2 (two) times daily.  180 tablet  1  . potassium chloride SA (K-DUR,KLOR-CON) 20 MEQ tablet Take 1 tablet (20 mEq total) by mouth daily.  3 tablet  0  . valACYclovir (VALTREX) 500 MG tablet       . DISCONTD: ALPRAZolam (XANAX) 1 MG tablet Take 1 tablet (1 mg total) by mouth at bedtime as needed for sleep.  30 tablet  3  . fluconazole (DIFLUCAN) 150 MG tablet Take 1 tablet (150 mg total) by mouth daily.  5 tablet  0   BP 140/90  Pulse 80  Temp 98 F (36.7 C) (Oral)  Ht 5\' 8"  (1.727 m)  Wt 155 lb (70.308 kg)  BMI 23.57 kg/m2  SpO2 96%  Review of Systems  Constitutional: Positive for fatigue. Negative for fever, chills, appetite change and unexpected weight change.  HENT: Negative for ear pain, congestion, sore throat, trouble swallowing, neck pain, voice change and sinus pressure.   Eyes: Negative for visual disturbance.  Respiratory: Negative for cough, shortness of breath, wheezing and stridor.   Cardiovascular: Negative for chest pain, palpitations and leg swelling.  Gastrointestinal: Negative for  nausea, vomiting, abdominal pain, diarrhea, constipation, blood in stool, abdominal distention and anal bleeding.  Genitourinary: Negative for dysuria and flank pain.  Musculoskeletal: Positive for myalgias and arthralgias. Negative for gait problem.  Skin: Negative for color change and rash.  Neurological: Negative for dizziness and headaches.  Hematological: Negative for adenopathy. Does not bruise/bleed easily.  Psychiatric/Behavioral: Negative for suicidal ideas, disturbed wake/sleep cycle and dysphoric mood. The patient is nervous/anxious.        Objective:    Physical Exam  Constitutional: She is oriented to person, place, and time. She appears well-developed and well-nourished. No distress.  HENT:  Head: Normocephalic and atraumatic.  Right Ear: External ear normal.  Left Ear: External ear normal.  Nose: Nose normal.  Mouth/Throat: Oropharynx is clear and moist. No oropharyngeal exudate.  Eyes: Conjunctivae normal are normal. Pupils are equal, round, and reactive to light. Right eye exhibits no discharge. Left eye exhibits no discharge. No scleral icterus.  Neck: Normal range of motion. Neck supple. No tracheal deviation present. No thyromegaly present.  Cardiovascular: Normal rate, regular rhythm, normal heart sounds and intact distal pulses.  Exam reveals no gallop and no friction rub.   No murmur heard. Pulmonary/Chest: Effort normal and breath sounds normal. No respiratory distress. She has no wheezes. She has no rales. She exhibits no tenderness.  Abdominal: Soft. Bowel sounds are normal. She exhibits no distension and no mass. There is no tenderness. There is no rebound and no guarding.  Musculoskeletal: Normal range of motion. She exhibits no edema and no tenderness.  Lymphadenopathy:    She has no cervical adenopathy.  Neurological: She is alert and oriented to person, place, and time. No cranial nerve deficit. She exhibits normal muscle tone. Coordination normal.  Skin: Skin is warm and dry. No rash noted. She is not diaphoretic. No erythema. No pallor.  Psychiatric: Her speech is normal and behavior is normal. Judgment and thought content normal. Her mood appears anxious. Cognition and memory are normal.          Assessment & Plan:

## 2011-11-18 NOTE — Assessment & Plan Note (Signed)
Patient noted to have markedly elevated blood sugar on recent labs. No known history of diabetes. New onset diabetes, hyperuricemia, and unintentional weight loss are concerning for malignancy. Would like to get CT of the abdomen for further evaluation however risk of nephrotoxicity from contrast high. Will get ultrasound of the abdomen for further evaluation.

## 2011-11-18 NOTE — Assessment & Plan Note (Signed)
Patient complains of thrush with use of Advair. Will start Diflucan x3 days. Encouraged her to continue washing out mouth after use of Advair.

## 2011-11-19 ENCOUNTER — Ambulatory Visit: Payer: Self-pay | Admitting: Internal Medicine

## 2011-11-19 ENCOUNTER — Telehealth: Payer: Self-pay | Admitting: Internal Medicine

## 2011-11-19 NOTE — Telephone Encounter (Signed)
Ultrasound of the abdomen was normal. 

## 2011-11-29 ENCOUNTER — Encounter: Payer: Self-pay | Admitting: Internal Medicine

## 2011-12-02 ENCOUNTER — Telehealth: Payer: Self-pay | Admitting: Internal Medicine

## 2011-12-02 NOTE — Telephone Encounter (Signed)
She has ACUTE RENAL FAILURE AND SHOULD NOT BE TAKING THESE DOSES OF ANTIBIOTICS!!!!  Please have her hold antibiotics today and tomorrow. We should see her tomorrow.She must be overbooked tomorrow. I would prefer to see her at 7:45am

## 2011-12-02 NOTE — Telephone Encounter (Signed)
Left message on cell phone voicemail advising patient as instructed, advised her to come in tomorrow morning to see Dr. Dan Humphreys.

## 2011-12-02 NOTE — Telephone Encounter (Signed)
Caller: [place]/Patient; Patient Name: Teresa Copeland; PCP: Ronna Polio (Adults only); Best Callback Phone Number: [phone] Seen by Dr. Al Corpus from [facility] is treating L 2cd toe infection- and 1/2 of foot is red. She has been taking Levofloxacin 750 mgs 1 PO daily x 10 days and Clinadamycin 150mg s 1 PO TID x 30 day since 12/01/11. Seen in [facility] on 04/02/11 and Cultures done and today-12/02/11 Xray done of L foot. Onset of sx; Blisters on 10/12 and pain in toe on 11/29/11. She was advised to do Epsom Salt Soaks. Blood Glucose = 147 in walk in and she has been very thirsty, urine is dark yellow but voiding QS.  She has had 3-4 bottles of water-slightly nauseous from antibiotic. Afebrile. Triage and Care advice per Foot Non- Injury Protocol and advised to be checked by Dr. Dan Humphreys within 24 hours (changed from 4 hours since already seen today and being treated). Dr. Tilman Neat schedule is full for10/18/13. 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 Dan Humphreys is comfortable with letting [facility] handle her care.

## 2011-12-03 NOTE — Telephone Encounter (Signed)
Patient called back to say that she overslept this morning that's why she didn't make the appt.  She stated that she will stop the antibiotic but she wants to know if you want to see her first thing Monday or what to do?  Please advise.

## 2011-12-03 NOTE — Telephone Encounter (Signed)
Why doesn't she take the Levaquin every other day and come in Monday for follow up?

## 2011-12-03 NOTE — Telephone Encounter (Signed)
Patient advised via telephone, follow up appt scheduled for 12/06/2011.

## 2011-12-06 ENCOUNTER — Encounter: Payer: BC Managed Care – PPO | Admitting: Internal Medicine

## 2011-12-07 ENCOUNTER — Ambulatory Visit (INDEPENDENT_AMBULATORY_CARE_PROVIDER_SITE_OTHER): Payer: BC Managed Care – PPO | Admitting: Internal Medicine

## 2011-12-07 ENCOUNTER — Encounter: Payer: Self-pay | Admitting: Internal Medicine

## 2011-12-07 ENCOUNTER — Other Ambulatory Visit: Payer: Self-pay | Admitting: Internal Medicine

## 2011-12-07 ENCOUNTER — Inpatient Hospital Stay: Payer: Self-pay | Admitting: Specialist

## 2011-12-07 VITALS — BP 120/70 | HR 93 | Temp 98.1°F | Ht 68.0 in | Wt 160.2 lb

## 2011-12-07 DIAGNOSIS — E119 Type 2 diabetes mellitus without complications: Secondary | ICD-10-CM

## 2011-12-07 DIAGNOSIS — N179 Acute kidney failure, unspecified: Secondary | ICD-10-CM

## 2011-12-07 DIAGNOSIS — L02619 Cutaneous abscess of unspecified foot: Secondary | ICD-10-CM

## 2011-12-07 DIAGNOSIS — B9562 Methicillin resistant Staphylococcus aureus infection as the cause of diseases classified elsewhere: Secondary | ICD-10-CM | POA: Insufficient documentation

## 2011-12-07 DIAGNOSIS — A4902 Methicillin resistant Staphylococcus aureus infection, unspecified site: Secondary | ICD-10-CM

## 2011-12-07 LAB — BASIC METABOLIC PANEL
Anion Gap: 10 (ref 7–16)
BUN: 18 mg/dL (ref 7–18)
Calcium, Total: 8.7 mg/dL (ref 8.5–10.1)
Chloride: 108 mmol/L — ABNORMAL HIGH (ref 98–107)
Creatinine: 1.76 mg/dL — ABNORMAL HIGH (ref 0.60–1.30)
EGFR (Non-African Amer.): 32 — ABNORMAL LOW
Osmolality: 277 (ref 275–301)
Potassium: 3.8 mmol/L (ref 3.5–5.1)

## 2011-12-07 LAB — CBC WITH DIFFERENTIAL/PLATELET
Basophil #: 0.1 10*3/uL (ref 0.0–0.1)
Eosinophil #: 0.5 10*3/uL (ref 0.0–0.7)
Lymphocyte %: 22.1 %
MCHC: 34.6 g/dL (ref 32.0–36.0)
Monocyte %: 5.5 %
Neutrophil #: 6.8 10*3/uL — ABNORMAL HIGH (ref 1.4–6.5)
Neutrophil %: 66.8 %
Platelet: 335 10*3/uL (ref 150–440)
RDW: 16.2 % — ABNORMAL HIGH (ref 11.5–14.5)

## 2011-12-07 MED ORDER — GLUCOSE BLOOD VI STRP: ORAL_STRIP | Status: AC

## 2011-12-07 NOTE — Progress Notes (Signed)
Subjective:    Patient ID: Teresa Copeland, female    DOB: February 25, 1955, 56 y.o.   MRN: [id number]  HPI 56 year old female with recent history of acute renal failure and new onset hyperglycemia presents for followup. In the interim since her last visit, she was evaluated by nephrology who felt that symptoms of acute renal failure most likely secondary to use of ibuprofen. She has stopped using ibuprofen. She is due to have renal function rechecked. Also in the interim since her last visit, she developed infection of the left second toe. She was seen at urgent care and then by a podiatrist to put her on Levaquin and clindamycin after culturing and reportedly finding MRSA infection. She reports that pain in her toe has been excruciating. She describes it as sharp like a stabbing sensation. It has been uncontrolled with hydrocodone which was given to her by her podiatrist. She reports that redness, swelling, and drainage have increased. She denies any fever or chills.  Outpatient Encounter Prescriptions as of 12/07/2011  Medication Sig Dispense Refill  . ADVAIR DISKUS 500-50 MCG/DOSE AEPB USE ONE INHALATION INTO THE LUNGS EVERY 12 HOURS  3 each  0  . albuterol (PROVENTIL HFA;VENTOLIN HFA) 108 (90 BASE) MCG/ACT inhaler Inhale 2 puffs into the lungs every 6 (six) hours as needed.        Marland Kitchen albuterol (PROVENTIL) (2.5 MG/3ML) 0.083% nebulizer solution Take 3 mLs (2.5 mg total) by nebulization every 6 (six) hours as needed.  75 mL  3  . ALPRAZolam (XANAX) 1 MG tablet Take 1 tablet (1 mg total) by mouth 3 (three) times daily as needed for sleep or anxiety.  90 tablet  3  . levofloxacin (LEVAQUIN) 750 MG tablet Take 750 mg by mouth daily.      . valACYclovir (VALTREX) 500 MG tablet       . atorvastatin (LIPITOR) 20 MG tablet Take 1 tablet (20 mg total) by mouth daily.  90 tablet  3  . eszopiclone (LUNESTA) 2 MG TABS Take 1 tablet (2 mg total) by mouth at bedtime. Take immediately before bedtime  30 tablet  0  .  glucose blood (FREESTYLE TEST STRIPS) test strip Use as instructed  100 each  12  . ibuprofen (ADVIL,MOTRIN) 800 MG tablet TAKE 1 TABLET EVERY 8 HOURS AS NEEDED FOR PAIN  90 tablet  3  . lisinopril-hydrochlorothiazide (PRINZIDE,ZESTORETIC) 10-12.5 MG per tablet Take 1 tablet by mouth 2 (two) times daily.  180 tablet  1  . potassium chloride SA (K-DUR,KLOR-CON) 20 MEQ tablet Take 1 tablet (20 mEq total) by mouth daily.  3 tablet  0  . DISCONTD: fluconazole (DIFLUCAN) 150 MG tablet Take 1 tablet (150 mg total) by mouth daily.  5 tablet  0   BP 120/70  Pulse 93  Temp 98.1 F (36.7 C) (Oral)  Ht 5\' 8"  (1.727 m)  Wt 160 lb 4 oz (72.689 kg)  BMI 24.37 kg/m2  SpO2 99%  Review of Systems  Constitutional: Negative for fever, chills, appetite change, fatigue and unexpected weight change.  HENT: Negative for ear pain, congestion, sore throat, trouble swallowing, neck pain, voice change and sinus pressure.   Eyes: Negative for visual disturbance.  Respiratory: Negative for cough, shortness of breath, wheezing and stridor.   Cardiovascular: Negative for chest pain, palpitations and leg swelling.  Gastrointestinal: Negative for nausea, vomiting, abdominal pain, diarrhea, constipation, blood in stool, abdominal distention and anal bleeding.  Genitourinary: Negative for dysuria and flank pain.  Musculoskeletal: Positive for myalgias,  joint swelling and arthralgias. Negative for gait problem.  Skin: Negative for color change and rash.  Neurological: Negative for dizziness and headaches.  Hematological: Negative for adenopathy. Does not bruise/bleed easily.  Psychiatric/Behavioral: Negative for suicidal ideas, disturbed wake/sleep cycle and dysphoric mood. The patient is not nervous/anxious.        Objective:   Physical Exam  Constitutional: She is oriented to person, place, and time. She appears well-developed and well-nourished. No distress.  HENT:  Head: Normocephalic and atraumatic.  Right Ear:  External ear normal.  Left Ear: External ear normal.  Nose: Nose normal.  Mouth/Throat: Oropharynx is clear and moist. No oropharyngeal exudate.  Eyes: Conjunctivae normal are normal. Pupils are equal, round, and reactive to light. Right eye exhibits no discharge. Left eye exhibits no discharge. No scleral icterus.  Neck: Normal range of motion. Neck supple. No tracheal deviation present. No thyromegaly present.  Pulmonary/Chest: Effort normal and breath sounds normal.  Musculoskeletal: Normal range of motion. She exhibits no edema and no tenderness.       Left foot: She exhibits tenderness, bony tenderness and swelling.       Feet:  Lymphadenopathy:    She has no cervical adenopathy.  Neurological: She is alert and oriented to person, place, and time. No cranial nerve deficit. She exhibits normal muscle tone. Coordination normal.  Skin: Skin is warm and dry. No rash noted. She is not diaphoretic. No erythema. No pallor.  Psychiatric: She has a normal mood and affect. Her behavior is normal. Judgment and thought content normal.          Assessment & Plan:

## 2011-12-07 NOTE — Assessment & Plan Note (Signed)
Exam consistent with cellulitis of the left foot with findings concerning for possible osteomyelitis of the left second toe. Will request records on recent culture and x-ray from her podiatrist, which per verbal report showed MRSA. Given that she has failed course of treatment with Levaquin and clindamycin, she will need admission for IV vancomycin. Discussed with her nephrologist. Followup here in 2 weeks.

## 2011-12-07 NOTE — Assessment & Plan Note (Signed)
Acute renal failure likely secondary to use of ibuprofen. Patient is due to have renal function rechecked today. However, given the acute issue of MRSA cellulitis, will have renal function rechecked on admission to the hospital.

## 2011-12-07 NOTE — Assessment & Plan Note (Signed)
Recent blood sugars have been elevated. Patient has no prior history of diabetes mellitus. Question of ongoing infection in her foot may of contributed to worsening blood sugars. Will write for her test strips for her use at home. She'll need to have A1c checked once acute issue of cellulitis has resolved. Unable to start metformin because of ongoing issues with renal failure. Depending on A1c and blood sugar test results, may need to start on low-dose insulin. Followup 2 weeks.

## 2011-12-09 LAB — CBC WITH DIFFERENTIAL/PLATELET
Basophil #: 0.1 10*3/uL (ref 0.0–0.1)
Eosinophil #: 0.5 10*3/uL (ref 0.0–0.7)
HCT: 28.6 % — ABNORMAL LOW (ref 35.0–47.0)
Lymphocyte #: 3.8 10*3/uL — ABNORMAL HIGH (ref 1.0–3.6)
Lymphocyte %: 43 %
MCH: 35.8 pg — ABNORMAL HIGH (ref 26.0–34.0)
MCHC: 35.1 g/dL (ref 32.0–36.0)
MCV: 102 fL — ABNORMAL HIGH (ref 80–100)
Monocyte #: 0.9 x10 3/mm (ref 0.2–0.9)
Neutrophil #: 3.6 10*3/uL (ref 1.4–6.5)
RBC: 2.81 10*6/uL — ABNORMAL LOW (ref 3.80–5.20)
RDW: 15.5 % — ABNORMAL HIGH (ref 11.5–14.5)

## 2011-12-09 LAB — BASIC METABOLIC PANEL
BUN: 15 mg/dL (ref 7–18)
Calcium, Total: 8.1 mg/dL — ABNORMAL LOW (ref 8.5–10.1)
Chloride: 113 mmol/L — ABNORMAL HIGH (ref 98–107)
EGFR (African American): >60
EGFR (Non-African Amer.): 55 — ABNORMAL LOW
Glucose: 96 mg/dL (ref 65–99)
Osmolality: 284 (ref 275–301)
Potassium: 3.7 mmol/L (ref 3.5–5.1)

## 2011-12-13 LAB — WOUND CULTURE

## 2011-12-17 ENCOUNTER — Ambulatory Visit (INDEPENDENT_AMBULATORY_CARE_PROVIDER_SITE_OTHER): Payer: BC Managed Care – PPO | Admitting: Internal Medicine

## 2011-12-17 ENCOUNTER — Telehealth: Payer: Self-pay

## 2011-12-17 ENCOUNTER — Encounter: Payer: Self-pay | Admitting: Internal Medicine

## 2011-12-17 VITALS — BP 160/90 | HR 100 | Temp 98.4°F | Ht 68.0 in | Wt 163.5 lb

## 2011-12-17 DIAGNOSIS — L03119 Cellulitis of unspecified part of limb: Secondary | ICD-10-CM

## 2011-12-17 DIAGNOSIS — F1120 Opioid dependence, uncomplicated: Secondary | ICD-10-CM

## 2011-12-17 DIAGNOSIS — A4902 Methicillin resistant Staphylococcus aureus infection, unspecified site: Secondary | ICD-10-CM

## 2011-12-17 DIAGNOSIS — F1920 Other psychoactive substance dependence, uncomplicated: Secondary | ICD-10-CM

## 2011-12-17 DIAGNOSIS — E119 Type 2 diabetes mellitus without complications: Secondary | ICD-10-CM | POA: Insufficient documentation

## 2011-12-17 DIAGNOSIS — B9562 Methicillin resistant Staphylococcus aureus infection as the cause of diseases classified elsewhere: Secondary | ICD-10-CM

## 2011-12-17 DIAGNOSIS — I10 Essential (primary) hypertension: Secondary | ICD-10-CM

## 2011-12-17 LAB — COMPREHENSIVE METABOLIC PANEL
ALT: 30 U/L (ref 0–35)
AST: 48 U/L — ABNORMAL HIGH (ref 0–37)
Albumin: 3.3 g/dL — ABNORMAL LOW (ref 3.5–5.2)
Alkaline Phosphatase: 122 U/L — ABNORMAL HIGH (ref 39–117)
Calcium: 8.2 mg/dL — ABNORMAL LOW (ref 8.4–10.5)
Chloride: 108 mEq/L (ref 96–112)
Potassium: 3.7 mEq/L (ref 3.5–5.1)
Sodium: 138 mEq/L (ref 135–145)

## 2011-12-17 LAB — POCT URINALYSIS DIPSTICK
Blood, UA: NEGATIVE
Ketones, UA: NEGATIVE
Spec Grav, UA: 1.02
Urobilinogen, UA: 0.2

## 2011-12-17 LAB — HEMOGLOBIN A1C: Hgb A1c MFr Bld: 5.9 % (ref 4.6–6.5)

## 2011-12-17 MED ORDER — METOPROLOL SUCCINATE ER 25 MG PO TB24: 25.0000 mg | ORAL_TABLET | Freq: Every day | ORAL | Status: DC

## 2011-12-17 MED ORDER — FREESTYLE SYSTEM KIT: 1.0000 | PACK | Status: DC | PRN

## 2011-12-17 MED ORDER — AMLODIPINE BESYLATE 5 MG PO TABS: 5.0000 mg | ORAL_TABLET | Freq: Every day | ORAL | Status: DC

## 2011-12-17 NOTE — Assessment & Plan Note (Signed)
BP elevated today. Will start Metoprolol 25mg  daily. Follow up 4 weeks.

## 2011-12-17 NOTE — Assessment & Plan Note (Signed)
Pt noted by family members to be using alcohol and narcotics, resulting in some confusion which led to recent fall and splenic rupture. Per pharmacy notes, pt has recently refilled Hydrocodone from another provider. Pt refuses urine sample testing today. Will check serum drug screen for opiates and ETOH.

## 2011-12-17 NOTE — Assessment & Plan Note (Signed)
S/p recent hospitalization. Pt reports culture showed MRSA. Will request records on this. She was discharged from [facility] with Rx for doxycycline, however allergic to this. Will see if MRSA on culture was sensitive to Clindamycin. This may be good option for her if culture shows MRSA susceptible to this. Will call with results once available. She has follow up with ID physician this afternoon and podiatrist in the future.

## 2011-12-17 NOTE — Progress Notes (Signed)
Subjective:    Patient ID: Teresa Copeland, female    DOB: 20-Sep-1955, 56 y.o.   MRN: [id number]  HPI 56 year old female with history of hypertension, chronic kidney disease, chronic arthralgia, alcohol abuse, tobacco abuse, and recent MRSA infection in her left foot presents for followup. At her last visit, she had cellulitis of her left foot and culture was positive for MRSA. She was ultimately sent to the ER for evaluation and was admitted for IV vancomycin. She reports that wound is healing well after debridement by her podiatrist. She is also being followed by ID physician and has followup with him today. She denies any recent fever or chills.  In regards to hypertension, she notes that she has been off her lisinopril because of issues with kidney dysfunction. She has not had any headache, palpitations, chest pain. She does not regularly check her blood pressure.  In regards to chronic pain, she is no longer using ibuprofen because of kidney dysfunction. Per pharmacy notes, she was dispensed a new prescription for Vicodin from another physician. We had stopped refilling narcotics that her office because of concerns about ongoing alcohol and tobacco abuse.  In regards to insomnia, she reports symptoms are well controlled of Lunesta. She requests refill of this medication today.  Outpatient Encounter Prescriptions as of 12/17/2011  Medication Sig Dispense Refill  . ADVAIR DISKUS 500-50 MCG/DOSE AEPB USE ONE INHALATION INTO THE LUNGS EVERY 12 HOURS  3 each  0  . albuterol (PROVENTIL HFA;VENTOLIN HFA) 108 (90 BASE) MCG/ACT inhaler Inhale 2 puffs into the lungs every 6 (six) hours as needed.        Marland Kitchen albuterol (PROVENTIL) (2.5 MG/3ML) 0.083% nebulizer solution Take 3 mLs (2.5 mg total) by nebulization every 6 (six) hours as needed.  75 mL  3  . ALPRAZolam (XANAX) 1 MG tablet Take 1 tablet (1 mg total) by mouth 3 (three) times daily as needed for sleep or anxiety.  90 tablet  3  . atorvastatin (LIPITOR)  20 MG tablet Take 1 tablet (20 mg total) by mouth daily.  90 tablet  3  . eszopiclone (LUNESTA) 2 MG TABS Take 1 tablet (2 mg total) by mouth at bedtime. Take immediately before bedtime  30 tablet  0  . glucose blood (FREESTYLE TEST STRIPS) test strip Use as instructed  100 each  12  . potassium chloride SA (K-DUR,KLOR-CON) 20 MEQ tablet Take 1 tablet (20 mEq total) by mouth daily.  3 tablet  0  . valACYclovir (VALTREX) 500 MG tablet       . glucose monitoring kit (FREESTYLE) monitoring kit 1 each by Does not apply route as needed for other.  1 each  0  . HYDROcodone-acetaminophen (VICODIN) 5-500 MG per tablet       . metoprolol succinate (TOPROL-XL) 25 MG 24 hr tablet Take 1 tablet (25 mg total) by mouth daily.  90 tablet  3   BP 160/90  Pulse 100  Temp 98.4 F (36.9 C) (Oral)  Ht 5\' 8"  (1.727 m)  Wt 163 lb 8 oz (74.163 kg)  BMI 24.86 kg/m2  SpO2 97%  Review of Systems  Constitutional: Negative for fever, chills, appetite change, fatigue and unexpected weight change.  HENT: Negative for ear pain, congestion, sore throat, trouble swallowing, neck pain, voice change and sinus pressure.   Eyes: Negative for visual disturbance.  Respiratory: Negative for cough, shortness of breath, wheezing and stridor.   Cardiovascular: Negative for chest pain, palpitations and leg swelling.  Gastrointestinal: Negative  for nausea, vomiting, abdominal pain, diarrhea, constipation, blood in stool, abdominal distention and anal bleeding.  Genitourinary: Negative for dysuria and flank pain.  Musculoskeletal: Positive for myalgias and arthralgias. Negative for gait problem.  Skin: Negative for color change and rash.  Neurological: Negative for dizziness and headaches.  Hematological: Negative for adenopathy. Does not bruise/bleed easily.  Psychiatric/Behavioral: Negative for suicidal ideas, disturbed wake/sleep cycle and dysphoric mood. The patient is not nervous/anxious.        Objective:   Physical Exam   Constitutional: She is oriented to person, place, and time. She appears well-developed and well-nourished. No distress.  HENT:  Head: Normocephalic and atraumatic.  Right Ear: External ear normal.  Left Ear: External ear normal.  Nose: Nose normal.  Mouth/Throat: Oropharynx is clear and moist. No oropharyngeal exudate.  Eyes: Conjunctivae normal are normal. Pupils are equal, round, and reactive to light. Right eye exhibits no discharge. Left eye exhibits no discharge. No scleral icterus.  Neck: Normal range of motion. Neck supple. No tracheal deviation present. No thyromegaly present.  Cardiovascular: Normal rate, regular rhythm, normal heart sounds and intact distal pulses.  Exam reveals no gallop and no friction rub.   No murmur heard. Pulmonary/Chest: Effort normal and breath sounds normal. No respiratory distress. She has no wheezes. She has no rales. She exhibits no tenderness.  Musculoskeletal: Normal range of motion. She exhibits no edema and no tenderness.       Left foot bandaged  Lymphadenopathy:    She has no cervical adenopathy.  Neurological: She is alert and oriented to person, place, and time. No cranial nerve deficit. She exhibits normal muscle tone. Coordination normal.  Skin: Skin is warm and dry. No rash noted. She is not diaphoretic. No erythema. No pallor.  Psychiatric: She has a normal mood and affect. Her behavior is normal. Judgment and thought content normal.          Assessment & Plan:

## 2011-12-17 NOTE — Assessment & Plan Note (Signed)
Will check A1c with labs today. Called in Rx for FreeStyle glucose monitor for pt to use.

## 2011-12-17 NOTE — Assessment & Plan Note (Signed)
Will repeat renal function with labs today. Pt has stopped Lisinopril and Ibuprofen. Follow up 4 weeks.

## 2011-12-17 NOTE — Telephone Encounter (Signed)
Pt was able to give urine specimen which showed traces of protein and leukocytes - ordered urin drug screen and culture.

## 2011-12-18 LAB — DRUG SCREEN, URINE
Benzodiazepines.: POSITIVE — AB
Creatinine,U: 139.22 mg/dL
Methadone: NEGATIVE
Propoxyphene: NEGATIVE

## 2011-12-19 LAB — URINE CULTURE: Organism ID, Bacteria: NO GROWTH

## 2011-12-26 LAB — DRUG SCREEN PANEL (SERUM)

## 2011-12-28 ENCOUNTER — Emergency Department: Payer: Self-pay | Admitting: Emergency Medicine

## 2011-12-28 LAB — URINALYSIS, COMPLETE
Bacteria: NONE SEEN
Bilirubin,UR: NEGATIVE
Glucose,UR: NEGATIVE mg/dL
Ketone: NEGATIVE
Nitrite: POSITIVE
Ph: 6
Protein: 100
RBC,UR: 370 /HPF
Specific Gravity: 1.011
Squamous Epithelial: NONE SEEN
WBC UR: 22439 /HPF

## 2011-12-28 LAB — BASIC METABOLIC PANEL
Anion Gap: 12 (ref 7–16)
BUN: 10 mg/dL (ref 7–18)
Calcium, Total: 8.4 mg/dL — ABNORMAL LOW (ref 8.5–10.1)
Chloride: 109 mmol/L — ABNORMAL HIGH (ref 98–107)
Co2: 21 mmol/L (ref 21–32)
Creatinine: 1.21 mg/dL (ref 0.60–1.30)
EGFR (African American): 58 — ABNORMAL LOW
Glucose: 99 mg/dL (ref 65–99)
Osmolality: 282 (ref 275–301)
Sodium: 142 mmol/L (ref 136–145)

## 2011-12-28 LAB — CBC WITH DIFFERENTIAL/PLATELET
Eosinophil: 6 %
HGB: 9 g/dL — ABNORMAL LOW (ref 12.0–16.0)
Lymphocytes: 24 %
MCV: 104 fL — ABNORMAL HIGH (ref 80–100)
Monocytes: 4 %
Platelet: 96 10*3/uL — ABNORMAL LOW (ref 150–440)
RBC: 2.47 10*6/uL — ABNORMAL LOW (ref 3.80–5.20)
Segmented Neutrophils: 66 %
WBC: 15.9 10*3/uL — ABNORMAL HIGH (ref 3.6–11.0)

## 2011-12-31 ENCOUNTER — Telehealth: Payer: Self-pay | Admitting: Internal Medicine

## 2011-12-31 NOTE — Telephone Encounter (Signed)
I don't see ED notes in Epic?  Maybe she was seen at [facility] [address]  Last K in our system was normal. May need to request labs from [facility].

## 2011-12-31 NOTE — Telephone Encounter (Signed)
Caller: [place]/Patient; Patient Name: Teresa Copeland; PCP: Ronna Polio (Adults only); Best Callback Phone Number: [phone] Pt has been advised to let her family MD that she was in the ED/[facility]. She was treated for a UTI. Pt was prescribed and  is taking Cipro 500mg  BID. Pt was notified that her K+ was low. (she does not know what it is). They did not administer any meds for low K+.  RN checked EPIC but did not see ED notes. Pt could not find d/c summary to let RN know what ED MD advised. Pt wants to know if Dr. Dan Humphreys needs to see her as a follow-up. Pt states the UTI does feel better but with the spleen removal on 07/2011 she has been informed to keep MD notified. OFFICE PLEASE CALL BACK IF PT NEEDS TO BE SEEN. Pt is going to be seen by Dr. Leavy Cella at 1:30 today. (podiatrist?)

## 2011-12-31 NOTE — Telephone Encounter (Signed)
Requested most recent medical records including labs and imaging from [facility].

## 2012-01-03 ENCOUNTER — Telehealth: Payer: Self-pay | Admitting: *Deleted

## 2012-01-03 NOTE — Telephone Encounter (Signed)
Called patient on cell phone voicemail and left a message for her to return call.  Dr. Dan Humphreys would like her to schedule a follow up appt (30 minutes).

## 2012-01-03 NOTE — Telephone Encounter (Signed)
See phone note from 01/02/2013, Dr. Dan Humphreys wants patient to schedule an appt.

## 2012-01-04 NOTE — Telephone Encounter (Signed)
Scheduled follow up appoint for patient on 12/6 @ 1530 for 30 minute appointment

## 2012-01-12 ENCOUNTER — Emergency Department: Payer: Self-pay | Admitting: Emergency Medicine

## 2012-01-12 LAB — COMPREHENSIVE METABOLIC PANEL
Albumin: 3.2 g/dL — ABNORMAL LOW (ref 3.4–5.0)
Anion Gap: 20 — ABNORMAL HIGH (ref 7–16)
BUN: 31 mg/dL — ABNORMAL HIGH (ref 7–18)
Bilirubin,Total: 0.3 mg/dL (ref 0.2–1.0)
Co2: 12 mmol/L — ABNORMAL LOW (ref 21–32)
Creatinine: 2.34 mg/dL — ABNORMAL HIGH (ref 0.60–1.30)
EGFR (African American): 26 — ABNORMAL LOW
Glucose: 89 mg/dL (ref 65–99)
Osmolality: 289 (ref 275–301)
SGOT(AST): 15 U/L (ref 15–37)
Sodium: 142 mmol/L (ref 136–145)
Total Protein: 7.4 g/dL (ref 6.4–8.2)

## 2012-01-12 LAB — CBC
HCT: 28.1 % — ABNORMAL LOW (ref 35.0–47.0)
HGB: 9.5 g/dL — ABNORMAL LOW (ref 12.0–16.0)
MCH: 37.4 pg — ABNORMAL HIGH (ref 26.0–34.0)
MCV: 110 fL — ABNORMAL HIGH (ref 80–100)
Platelet: 370 10*3/uL (ref 150–440)
RBC: 2.55 10*6/uL — ABNORMAL LOW (ref 3.80–5.20)
RDW: 19 % — ABNORMAL HIGH (ref 11.5–14.5)

## 2012-01-12 LAB — URINALYSIS, COMPLETE
Glucose,UR: NEGATIVE mg/dL (ref 0–75)
Hyaline Cast: 5
Leukocyte Esterase: NEGATIVE
Nitrite: NEGATIVE
Ph: 6 (ref 4.5–8.0)
Protein: 100
Specific Gravity: 1.014 (ref 1.003–1.030)

## 2012-01-18 LAB — CULTURE, BLOOD (SINGLE)

## 2012-01-19 ENCOUNTER — Telehealth: Payer: Self-pay | Admitting: General Practice

## 2012-01-19 NOTE — Telephone Encounter (Signed)
One of her family members needs to come with her to her next appointment. They have made lots of accusations by phone about alcohol use and drug addiction, but need to be present when I try to address this with her.

## 2012-01-19 NOTE — Telephone Encounter (Signed)
Pt sister called asking if we could no longer refill the Xanax. Stated her sister is addicted to this med and is currently at [facility] for a recent fall. Please advise.

## 2012-01-20 ENCOUNTER — Other Ambulatory Visit: Payer: Self-pay | Admitting: Internal Medicine

## 2012-01-20 ENCOUNTER — Telehealth: Payer: Self-pay | Admitting: General Practice

## 2012-01-20 NOTE — Telephone Encounter (Signed)
It would be helpful if he can come with her to her visit.

## 2012-01-20 NOTE — Telephone Encounter (Signed)
Pt son Apolinar Junes called to discuss pt care with you. Pt has an appt next week on 12/13 @ 2:30 so that she could have 30 minutes for hosp follow up.

## 2012-01-20 NOTE — Telephone Encounter (Signed)
Pt sister called back stating that a person addicted to meds would not allow a family member to be present at the time of appt. Sister was told that per Dr. Dan Humphreys this is the next course in stopping the medication refill process. Emergency contact for pt is her son. Per Pt DPR not information is to be released to anyone. Message was forwarded to [employer].

## 2012-01-20 NOTE — Telephone Encounter (Signed)
Pt son notified of the appt time and stated he or another family member would try to make the appt.

## 2012-01-20 NOTE — Telephone Encounter (Signed)
Pt sister notified of provider request of a family member being present at next exam.

## 2012-01-21 ENCOUNTER — Ambulatory Visit: Payer: BC Managed Care – PPO | Admitting: Internal Medicine

## 2012-01-26 ENCOUNTER — Ambulatory Visit: Payer: BC Managed Care – PPO | Admitting: Internal Medicine

## 2012-01-28 ENCOUNTER — Ambulatory Visit: Payer: BC Managed Care – PPO | Admitting: Internal Medicine

## 2012-03-06 ENCOUNTER — Ambulatory Visit: Payer: BC Managed Care – PPO | Admitting: Internal Medicine

## 2012-03-13 ENCOUNTER — Telehealth: Payer: Self-pay | Admitting: *Deleted

## 2012-03-13 NOTE — Telephone Encounter (Signed)
Rx request:  Alprazolam 1 mg Tablet  Take 1 tablet by mouth 3 times daily as needed for sleep and anxiety

## 2012-03-13 NOTE — Telephone Encounter (Signed)
No. Pt missed follow up appointment and has h/o narcotic and alcohol abuse. Needs follow up

## 2012-03-13 NOTE — Telephone Encounter (Signed)
LMOVM for pt to return call and make an appt for medication refills.

## 2012-03-15 ENCOUNTER — Telehealth: Payer: Self-pay | Admitting: *Deleted

## 2012-03-15 NOTE — Telephone Encounter (Signed)
I sent a rejection note for this medication to her pharmacy. Pt has a history of alcohol and narcotic abuse. She did not keep scheduled follow up. We can not refill this Rx.

## 2012-03-15 NOTE — Telephone Encounter (Signed)
Refill request  Alprazolam 1 mg  Take 1 tablet by mouth 3 times daily as needed for sleep or anxiety

## 2012-03-16 NOTE — Telephone Encounter (Signed)
.  left message to have patient return my call, also advised pt to call to make an appt before any refills can be done

## 2012-03-17 ENCOUNTER — Ambulatory Visit: Payer: BC Managed Care – PPO | Admitting: Internal Medicine

## 2012-03-27 ENCOUNTER — Emergency Department: Payer: Self-pay | Admitting: Emergency Medicine

## 2012-03-27 LAB — CBC
HCT: 33.1 % — ABNORMAL LOW (ref 35.0–47.0)
HGB: 11.3 g/dL — ABNORMAL LOW (ref 12.0–16.0)
MCH: 34.8 pg — ABNORMAL HIGH (ref 26.0–34.0)
MCV: 102 fL — ABNORMAL HIGH (ref 80–100)
Platelet: 311 10*3/uL (ref 150–440)
RBC: 3.23 10*6/uL — ABNORMAL LOW (ref 3.80–5.20)
RDW: 19.2 % — ABNORMAL HIGH (ref 11.5–14.5)
WBC: 10.5 10*3/uL (ref 3.6–11.0)

## 2012-03-27 LAB — COMPREHENSIVE METABOLIC PANEL
Alkaline Phosphatase: 229 U/L — ABNORMAL HIGH (ref 50–136)
Anion Gap: 10 (ref 7–16)
BUN: 9 mg/dL (ref 7–18)
Calcium, Total: 8.6 mg/dL (ref 8.5–10.1)
SGOT(AST): 22 U/L (ref 15–37)
SGPT (ALT): 13 U/L (ref 12–78)
Total Protein: 6.7 g/dL (ref 6.4–8.2)

## 2012-03-27 LAB — TRIGLYCERIDES: Triglycerides: 145 mg/dL (ref 0–200)

## 2012-04-21 ENCOUNTER — Ambulatory Visit: Payer: BC Managed Care – PPO | Admitting: Internal Medicine

## 2012-04-25 ENCOUNTER — Ambulatory Visit: Payer: Self-pay | Admitting: Internal Medicine

## 2012-04-25 ENCOUNTER — Encounter: Payer: Self-pay | Admitting: Adult Health

## 2012-04-25 ENCOUNTER — Ambulatory Visit (INDEPENDENT_AMBULATORY_CARE_PROVIDER_SITE_OTHER): Payer: BC Managed Care – PPO | Admitting: Adult Health

## 2012-04-25 VITALS — BP 97/64 | HR 97 | Temp 98.5°F | Resp 14 | Ht 67.0 in | Wt 122.0 lb

## 2012-04-25 DIAGNOSIS — I10 Essential (primary) hypertension: Secondary | ICD-10-CM

## 2012-04-25 DIAGNOSIS — F411 Generalized anxiety disorder: Secondary | ICD-10-CM

## 2012-04-25 DIAGNOSIS — R4189 Other symptoms and signs involving cognitive functions and awareness: Secondary | ICD-10-CM

## 2012-04-25 DIAGNOSIS — R296 Repeated falls: Secondary | ICD-10-CM

## 2012-04-25 DIAGNOSIS — Z9181 History of falling: Secondary | ICD-10-CM

## 2012-04-25 DIAGNOSIS — F419 Anxiety disorder, unspecified: Secondary | ICD-10-CM

## 2012-04-25 MED ORDER — METOPROLOL TARTRATE 25 MG PO TABS: ORAL_TABLET | ORAL | Status: DC

## 2012-04-25 NOTE — Assessment & Plan Note (Addendum)
Repetitive speech. Patient has continually asked for Xanax throughout the visit. Her sister, who is with her, reports that patient has had no alcohol in approximately one month. She is currently not on any narcotic medications. Her cognitive decline may be related to pathology, substance abuse. I will order a CT of the head to rule out any acute abnormalities. Patient has had recent hospitalization for pancreatitis at [facility] in February. I will request hospital records prior to ordering further labs. Patient's sister reports that pt's back/shoulder doctor gave patient clearance to return to work. Given current clinical presentation, I highly doubt that patient will be able to return to work on Monday. Note, greater than 25 minutes were spent in face-to-face communication with patient and family in discussion, assessment and planning for current problems.

## 2012-04-25 NOTE — Assessment & Plan Note (Signed)
Hx of substance abuse which may have contributed to some of her falls. I was also concerned with her low b/p which could also contribute. Currently, patient is not on any narcotic medication. Will not prescribe any. Change in her b/p medication.

## 2012-04-25 NOTE — Assessment & Plan Note (Addendum)
B/P is low today. Her sister reports that it has been low recently as well. Currently on Toprol XL 25 mg. I am discontinuing this medication and adding a short acting metoprolol 25 mg. Advised to hold if b/p below 110/70. Give 1/2 tablet if b/p between 120/80 - 130/80. Give full tablet if b/p over 130/80. Her significant weight loss may have decreased her need for b/p medication. Her low b/p may also be contributing to recent falls. Her sister will monitor b/p and we will make changes accordingly.

## 2012-04-25 NOTE — Assessment & Plan Note (Signed)
Patient is requesting xanax to help her sleep. I cannot prescribe this for her at this time given her recent hx of falls, altered cognition and hx of substance abuse.

## 2012-04-25 NOTE — Progress Notes (Signed)
Subjective:    Patient ID: Teresa Copeland, female    DOB: 11-22-1955, 57 y.o.   MRN: [id number]  HPI  Patient is a 57 y/o female who presents to clinic today with her sister. Pt's sister has noticed some cognitive decline since her fall in November 2013 while at work. Patient injured her shoulder and back during fall but also hit her head. Her sister reports that she was sent for shoulder and back evaluation and treatment; however, there was no w/u for her hitting her head. Patient reports that the physician she saw advised her that "she would have a bump on her head but it was nothing". Pt's sister has noticed an increase in her cognitive decline within the last several weeks.  Recent falls. Sister reports that patient has been falling. No report of recent injury. She has a history of substance abuse including prescription medications and alcohol. Patient reports she is having trouble sleeping. She further states that she cannot take the [facility] but she is able to tolerate the alprazolam. Patient is requesting alprazolam.    Current Outpatient Prescriptions on File Prior to Visit  Medication Sig Dispense Refill  . ADVAIR DISKUS 500-50 MCG/DOSE AEPB USE ONE INHALATION INTO THE LUNGS EVERY 12 HOURS  3 each  0  . albuterol (PROVENTIL HFA;VENTOLIN HFA) 108 (90 BASE) MCG/ACT inhaler Inhale 2 puffs into the lungs every 6 (six) hours as needed.        Marland Kitchen albuterol (PROVENTIL) (2.5 MG/3ML) 0.083% nebulizer solution Take 3 mLs (2.5 mg total) by nebulization every 6 (six) hours as needed.  75 mL  3  . valACYclovir (VALTREX) 500 MG tablet       . atorvastatin (LIPITOR) 20 MG tablet Take 1 tablet (20 mg total) by mouth daily.  90 tablet  3  . glucose blood (FREESTYLE TEST STRIPS) test strip Use as instructed  100 each  12  . glucose monitoring kit (FREESTYLE) monitoring kit 1 each by Does not apply route as needed for other.  1 each  0  . potassium chloride SA (K-DUR,KLOR-CON) 20 MEQ tablet Take 1 tablet (20  mEq total) by mouth daily.  3 tablet  0   No current facility-administered medications on file prior to visit.      Review of Systems  Constitutional: Positive for appetite change. Negative for fever and chills.  HENT: Negative.   Eyes: Negative for visual disturbance.  Respiratory: Positive for cough. Negative for shortness of breath and wheezing.   Cardiovascular: Negative for chest pain.  Gastrointestinal: Negative for nausea and abdominal pain.  Genitourinary: Negative.   Neurological: Positive for dizziness and light-headedness. Negative for numbness and headaches.  Psychiatric/Behavioral: Positive for sleep disturbance. Negative for agitation. The patient is not nervous/anxious.        Repetitive speech    BP 97/64  Pulse 97  Temp(Src) 98.5 F (36.9 C) (Oral)  Resp 14  Ht 5\' 7"  (1.702 m)  Wt 122 lb (55.339 kg)  BMI 19.1 kg/m2  SpO2 100%     Objective:   Physical Exam  Constitutional: No distress.  Frail appearing. Appears older than stated age.  HENT:  Head: Normocephalic.  Right Ear: External ear normal.  Left Ear: External ear normal.  Mouth/Throat: Oropharynx is clear and moist.  Edentulous   Eyes: Pupils are equal, round, and reactive to light.  Cardiovascular: Normal rate, regular rhythm and normal heart sounds.  Exam reveals no gallop.   No murmur heard. B/p low. She has recently  lost ~ 40 lbs. Currently on Toprol 25 mg daily.  Pulmonary/Chest: Effort normal. She has no wheezes.  Rhonchi posterior upper left lobe. Clears with cough  Abdominal: Soft. Bowel sounds are normal.  Musculoskeletal: She exhibits no edema.  Slow, cautious gait  Lymphadenopathy:    She has no cervical adenopathy.  Neurological: She is alert.  Oriented to person, place. Patient repeats herself frequently. Altered balance.  Skin: Skin is warm and dry.  Psychiatric:  Calm demeanor. Asking for xanax to help her sleep.       Assessment & Plan:

## 2012-04-26 ENCOUNTER — Telehealth: Payer: Self-pay | Admitting: *Deleted

## 2012-04-26 NOTE — Telephone Encounter (Signed)
Refill request  Alprazolam 1 mg tablet  #90  Take 1 tablet by mouth 3 times daily as needed for sleep or anxiety

## 2012-04-26 NOTE — Telephone Encounter (Signed)
Please let patient know that her CT of the head did not show any changes. I am not sure that the patient will fully understand you. She came in the office the other day with some cognitive issues. She came with her sister. The sister has been caring for her so we probably need to get permission from patient to talk to the sister.

## 2012-04-26 NOTE — Telephone Encounter (Signed)
We cannot share information with her sister unless pt gives written permission for this. CT was normal with no acute changes- I think Teresa Copeland sent this as a message to patient.

## 2012-04-26 NOTE — Telephone Encounter (Signed)
No. We not fill controlled meds for her.

## 2012-04-26 NOTE — Telephone Encounter (Signed)
Pt sister, Jamesetta So called wanting results of CT scan. I did not see anything on her chart yet. Also sister is not listed on DPR. Please advise.

## 2012-04-27 NOTE — Telephone Encounter (Signed)
Pt sister called and was given message. Would like a call back in regards to pt returning to work on Monday, paperwork was given to you at the appt. Could you please call her back to discuss this matter at [phone]

## 2012-04-28 NOTE — Telephone Encounter (Signed)
Patient called stating she has been really sick and she can not even remember what is going on for the last 2 months. Her job is requesting she come back to work, when she was here, her sister gave the number and information about her worker's compensation but no one was called. She can not even drive yet, so no way she can go back to work. Patient was not very clear in reference to what she need. Informed patient she could send [place] the paperwork but we can not call her job for her. Patient stated her sister has all this information and not real sure what she needs but once she figures it out she will let us know

## 2012-04-28 NOTE — Telephone Encounter (Signed)
I returned phone call to patient's sister and informed her that I can't discuss patient information with her until we have written consent from the patient. She would like to speak with our Print production planner and this call will be routed to [employer]

## 2012-04-30 NOTE — Telephone Encounter (Signed)
Her sister came with patient to visit. Patient has been out on worker's comp but there is also some alcohol and narcotic misuse. Patient was sent for CT scan because of changes in mental status. The sister did not give me any paperwork. The sister gave me information to send pt's information to worker's comp. The more I thought about this the more I felt I needed to find out further regarding how to handle this. I offered writing a letter stating she was not ready to return to work (which I agree she cannot). I don't think I can send anything out without the patient giving me permission and I am not sure the patient understands what she would be consenting to.

## 2012-05-02 NOTE — Telephone Encounter (Signed)
I called and spoke with Jamesetta So, she states the information isn't going to workers comp it is going to her disability.  She states that her sister did try to go to work yesterday at [employer] and the nurse sent her home.  She needs the disability information sent to [facility] fax [phone] or phone [phone] the claim number is [id number].  Her sister says this is due to sickness not the workers comp case.

## 2012-05-02 NOTE — Telephone Encounter (Signed)
Carollee Herter, I am re-routing this to you so that you will have the information when you call Jamesetta So, patient's sister.  Thanks, Raquel

## 2012-05-02 NOTE — Telephone Encounter (Signed)
I have called and made an appointment for patient to see Dr. Dan Humphreys in reference to the paperwork that she needs to have filled out.

## 2012-05-04 ENCOUNTER — Encounter: Payer: Self-pay | Admitting: Internal Medicine

## 2012-05-04 ENCOUNTER — Ambulatory Visit (INDEPENDENT_AMBULATORY_CARE_PROVIDER_SITE_OTHER): Payer: BC Managed Care – PPO | Admitting: Internal Medicine

## 2012-05-04 VITALS — BP 120/90 | HR 83 | Temp 98.6°F | Wt 120.0 lb

## 2012-05-04 DIAGNOSIS — F419 Anxiety disorder, unspecified: Secondary | ICD-10-CM

## 2012-05-04 DIAGNOSIS — M545 Low back pain, unspecified: Secondary | ICD-10-CM

## 2012-05-04 DIAGNOSIS — G8929 Other chronic pain: Secondary | ICD-10-CM

## 2012-05-04 DIAGNOSIS — F411 Generalized anxiety disorder: Secondary | ICD-10-CM

## 2012-05-04 DIAGNOSIS — G4700 Insomnia, unspecified: Secondary | ICD-10-CM

## 2012-05-04 DIAGNOSIS — I10 Essential (primary) hypertension: Secondary | ICD-10-CM

## 2012-05-04 DIAGNOSIS — F1010 Alcohol abuse, uncomplicated: Secondary | ICD-10-CM

## 2012-05-04 DIAGNOSIS — D649 Anemia, unspecified: Secondary | ICD-10-CM

## 2012-05-04 LAB — CBC WITH DIFFERENTIAL/PLATELET
Basophils Absolute: 0.1 10*3/uL (ref 0.0–0.1)
Basophils Relative: 0.7 % (ref 0.0–3.0)
Eosinophils Absolute: 0.6 10*3/uL (ref 0.0–0.7)
Lymphocytes Relative: 19.1 % (ref 12.0–46.0)
MCHC: 33.3 g/dL (ref 30.0–36.0)
Neutrophils Relative %: 66.3 % (ref 43.0–77.0)
RBC: 3.02 Mil/uL — ABNORMAL LOW (ref 3.87–5.11)
RDW: 15.7 % — ABNORMAL HIGH (ref 11.5–14.6)

## 2012-05-04 LAB — COMPREHENSIVE METABOLIC PANEL
AST: 15 U/L (ref 0–37)
Albumin: 3.4 g/dL — ABNORMAL LOW (ref 3.5–5.2)
BUN: 8 mg/dL (ref 6–23)
CO2: 22 mEq/L (ref 19–32)
Calcium: 9.2 mg/dL (ref 8.4–10.5)
Chloride: 101 mEq/L (ref 96–112)
GFR: 56.27 mL/min — ABNORMAL LOW (ref >60.00)
Potassium: 3.1 mEq/L — ABNORMAL LOW (ref 3.5–5.1)

## 2012-05-04 NOTE — Assessment & Plan Note (Signed)
Encouraged better sleep hygiene. She will followup with psychiatry regarding chronic insomnia.

## 2012-05-04 NOTE — Progress Notes (Signed)
Subjective:    Patient ID: Teresa Copeland, female    DOB: November 10, 1955, 57 y.o.   MRN: [id number]  HPI 57 year old female with history of alcohol and narcotic abuse, splenic rupture requiring splenectomy, tobacco abuse and COPD, hypertension presents for followup. She reports significant lower back pain. She requests refill on hydrocodone and alprazolam. She reports no improvement with medications such as Tylenol, ibuprofen. She has had no improvement with muscle relaxers. She also reports significant anxiety and difficulty sleeping. She has not followed by psychiatry. She reports she is quit drinking. She is not currently working.  Outpatient Encounter Prescriptions as of 05/04/2012  Medication Sig Dispense Refill  . ADVAIR DISKUS 500-50 MCG/DOSE AEPB USE ONE INHALATION INTO THE LUNGS EVERY 12 HOURS  3 each  0  . albuterol (PROVENTIL HFA;VENTOLIN HFA) 108 (90 BASE) MCG/ACT inhaler Inhale 2 puffs into the lungs every 6 (six) hours as needed.        Marland Kitchen atorvastatin (LIPITOR) 20 MG tablet Take 1 tablet (20 mg total) by mouth daily.  90 tablet  3  . glucose blood (FREESTYLE TEST STRIPS) test strip Use as instructed  100 each  12  . glucose monitoring kit (FREESTYLE) monitoring kit 1 each by Does not apply route as needed for other.  1 each  0  . metoprolol tartrate (LOPRESSOR) 25 MG tablet If b/p greater than 130/80 take 1 whole tablet. Take 1/2 tablet if b/p 120/80 to 130/80. If b/p below 110/70 hold medication.  30 tablet  3  . valACYclovir (VALTREX) 500 MG tablet       . albuterol (PROVENTIL) (2.5 MG/3ML) 0.083% nebulizer solution Take 3 mLs (2.5 mg total) by nebulization every 6 (six) hours as needed.  75 mL  3  . cyclobenzaprine (FLEXERIL) 10 MG tablet       . ondansetron (ZOFRAN-ODT) 4 MG disintegrating tablet       . potassium chloride SA (K-DUR,KLOR-CON) 20 MEQ tablet Take 1 tablet (20 mEq total) by mouth daily.  3 tablet  0   No facility-administered encounter medications on file as of 05/04/2012.    BP 120/90  Pulse 83  Temp(Src) 98.6 F (37 C) (Oral)  Wt 120 lb (54.432 kg)  BMI 18.79 kg/m2  SpO2 97%  Review of Systems  Constitutional: Negative for fever, chills, appetite change, fatigue and unexpected weight change.  HENT: Negative for ear pain, congestion, sore throat, trouble swallowing, neck pain, voice change and sinus pressure.   Eyes: Negative for visual disturbance.  Respiratory: Negative for cough, shortness of breath, wheezing and stridor.   Cardiovascular: Negative for chest pain, palpitations and leg swelling.  Gastrointestinal: Negative for nausea, vomiting, abdominal pain, diarrhea, constipation, blood in stool, abdominal distention and anal bleeding.  Genitourinary: Negative for dysuria and flank pain.  Musculoskeletal: Positive for myalgias, back pain and arthralgias. Negative for gait problem.  Skin: Negative for color change and rash.  Neurological: Negative for dizziness and headaches.  Hematological: Negative for adenopathy. Does not bruise/bleed easily.  Psychiatric/Behavioral: Positive for sleep disturbance and dysphoric mood. Negative for suicidal ideas. The patient is nervous/anxious.        Objective:   Physical Exam  Constitutional: She is oriented to person, place, and time. She appears well-developed and well-nourished. No distress.  HENT:  Head: Normocephalic and atraumatic.  Right Ear: External ear normal.  Left Ear: External ear normal.  Nose: Nose normal.  Mouth/Throat: Oropharynx is clear and moist. No oropharyngeal exudate.  Eyes: Conjunctivae are normal. Pupils are equal,  round, and reactive to light. Right eye exhibits no discharge. Left eye exhibits no discharge. No scleral icterus.  Neck: Normal range of motion. Neck supple. No tracheal deviation present. No thyromegaly present.  Cardiovascular: Normal rate, regular rhythm, normal heart sounds and intact distal pulses.  Exam reveals no gallop and no friction rub.   No murmur  heard. Pulmonary/Chest: Effort normal and breath sounds normal. No respiratory distress. She has no wheezes. She has no rales. She exhibits no tenderness.  Musculoskeletal: Normal range of motion. She exhibits no edema and no tenderness.  Lymphadenopathy:    She has no cervical adenopathy.  Neurological: She is alert and oriented to person, place, and time. No cranial nerve deficit. She exhibits normal muscle tone. Coordination normal.  Skin: Skin is warm and dry. No rash noted. She is not diaphoretic. No erythema. No pallor.  Psychiatric: Her speech is normal and behavior is normal. Judgment and thought content normal. Her mood appears anxious. Cognition and memory are impaired.          Assessment & Plan:

## 2012-05-04 NOTE — Assessment & Plan Note (Signed)
Patient with chronic low back pain. Likely secondary to degenerative arthritis. Will set up referral to pain management. Encouraged her to use plain Tylenol as needed for pain. No narcotic prescriptions from this office given long history of narcotic, benzodiazepine, and alcohol abuse.

## 2012-05-04 NOTE — Assessment & Plan Note (Signed)
Patient reports significant anxiety. Requests refill on Xanax. As per previous, we discussed that we are unable to write for controlled medications for her. 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 Offered psychiatry referral for her will set this up.

## 2012-05-04 NOTE — Assessment & Plan Note (Signed)
BP Readings from Last 3 Encounters:  05/04/12 120/90  04/25/12 97/64  12/17/11 160/90   Blood pressure fairly well-controlled on metoprolol. Will continue.

## 2012-05-08 ENCOUNTER — Telehealth: Payer: Self-pay | Admitting: Internal Medicine

## 2012-05-08 ENCOUNTER — Telehealth: Payer: Self-pay | Admitting: Emergency Medicine

## 2012-05-08 NOTE — Telephone Encounter (Signed)
No. She did not bring any paperwork with her to that visit. I asked her to bring it by. However, we do not do disability assessments, so she should be aware of this.

## 2012-05-08 NOTE — Telephone Encounter (Signed)
Spoke with patient and informed her we do not have any disability papers for her, she did not/has not dropped off any papers at neither of her visits. Dr. Dan Humphreys nor Raquel has any papers to be filled out for her. She continues to state her sister dropped them off when they came in to see Raquel and this has been explained to her numerous times that we do not have any paperwork. She agreed with this and said ok, she will check with her sister.

## 2012-05-08 NOTE — Telephone Encounter (Signed)
Dr. Dan Humphreys,   Teresa Copeland called regarding her disability paper work. Did she bring it with her to the office when she came for her appointment on 3/20?

## 2012-05-08 NOTE — Telephone Encounter (Signed)
Patient waiting on her disability paper work. She has been requesting paper work since last week.

## 2012-05-15 ENCOUNTER — Encounter: Payer: Self-pay | Admitting: Internal Medicine

## 2012-05-31 ENCOUNTER — Ambulatory Visit: Payer: BC Managed Care – PPO | Admitting: Internal Medicine

## 2012-06-14 ENCOUNTER — Telehealth: Payer: Self-pay | Admitting: Internal Medicine

## 2012-06-14 NOTE — Telephone Encounter (Signed)
Office has closed for the day, will call back tomorrow

## 2012-06-14 NOTE — Telephone Encounter (Signed)
Teresa Copeland calling from Dr. Shelda Altes office to notify the pt did not show for her appt this morning.

## 2012-06-15 NOTE — Telephone Encounter (Signed)
Spoke with Marcelino Duster, she stated she just wanted to let us know patient no showed her appointment yesterday.

## 2012-08-07 ENCOUNTER — Ambulatory Visit: Payer: BC Managed Care – PPO | Admitting: Internal Medicine

## 2012-10-04 ENCOUNTER — Emergency Department: Payer: Self-pay | Admitting: Internal Medicine

## 2012-10-26 ENCOUNTER — Ambulatory Visit: Payer: Self-pay | Admitting: Internal Medicine

## 2012-10-30 LAB — COMPREHENSIVE METABOLIC PANEL
Albumin: 3.2 g/dL — ABNORMAL LOW (ref 3.4–5.0)
Alkaline Phosphatase: 194 U/L — ABNORMAL HIGH (ref 50–136)
BUN: 4 mg/dL — ABNORMAL LOW (ref 7–18)
Bilirubin,Total: 0.4 mg/dL (ref 0.2–1.0)
Calcium, Total: 8.5 mg/dL (ref 8.5–10.1)
EGFR (African American): >60
EGFR (Non-African Amer.): >60
Osmolality: 282 (ref 275–301)
Potassium: 2.9 mmol/L — ABNORMAL LOW (ref 3.5–5.1)
SGPT (ALT): 23 U/L (ref 12–78)
Sodium: 142 mmol/L (ref 136–145)

## 2012-10-30 LAB — URINALYSIS, COMPLETE
Bacteria: NONE SEEN
Bilirubin,UR: NEGATIVE
Blood: NEGATIVE
Glucose,UR: NEGATIVE mg/dL (ref 0–75)
Ketone: NEGATIVE
Nitrite: NEGATIVE
Ph: 7 (ref 4.5–8.0)
Protein: NEGATIVE
RBC,UR: <1 /HPF (ref 0–5)
Specific Gravity: 1.005 (ref 1.003–1.030)
Squamous Epithelial: <1
WBC UR: 1 /HPF (ref 0–5)

## 2012-10-30 LAB — TROPONIN I: Troponin-I: 0.03 ng/mL

## 2012-10-30 LAB — DRUG SCREEN, URINE
Cannabinoid 50 Ng, Ur ~~LOC~~: NEGATIVE (ref ?–50)
Cocaine Metabolite,Ur ~~LOC~~: NEGATIVE (ref ?–300)
Methadone, Ur Screen: NEGATIVE (ref ?–300)
Opiate, Ur Screen: NEGATIVE (ref ?–300)
Phencyclidine (PCP) Ur S: NEGATIVE (ref ?–25)
Tricyclic, Ur Screen: NEGATIVE (ref ?–1000)

## 2012-10-30 LAB — CBC
HGB: 12.9 g/dL (ref 12.0–16.0)
MCH: 36 pg — ABNORMAL HIGH (ref 26.0–34.0)
MCV: 105 fL — ABNORMAL HIGH (ref 80–100)
RBC: 3.58 10*6/uL — ABNORMAL LOW (ref 3.80–5.20)
RDW: 14.3 % (ref 11.5–14.5)

## 2012-10-30 LAB — ETHANOL: Ethanol: 243 mg/dL

## 2012-10-31 ENCOUNTER — Inpatient Hospital Stay: Payer: Self-pay | Admitting: Psychiatry

## 2012-10-31 LAB — MAGNESIUM: Magnesium: 1.2 mg/dL — ABNORMAL LOW

## 2012-10-31 LAB — ETHANOL
Ethanol %: <0.003 % (ref 0.000–0.080)
Ethanol: <3 mg/dL

## 2012-11-02 LAB — FOLATE: Folic Acid: 19.6 ng/mL (ref 3.1–100.0)

## 2012-11-04 LAB — LIPID PANEL
Cholesterol: 240 mg/dL — ABNORMAL HIGH (ref 0–200)
Ldl Cholesterol, Calc: 132 mg/dL — ABNORMAL HIGH (ref 0–100)

## 2012-11-05 DIAGNOSIS — I200 Unstable angina: Secondary | ICD-10-CM

## 2012-11-05 LAB — CBC WITH DIFFERENTIAL/PLATELET
Basophil %: 1.1 %
Eosinophil #: 0.4 10*3/uL (ref 0.0–0.7)
HGB: 9.1 g/dL — ABNORMAL LOW (ref 12.0–16.0)
Lymphocyte %: 36.8 %
MCH: 36.8 pg — ABNORMAL HIGH (ref 26.0–34.0)
MCHC: 34.5 g/dL (ref 32.0–36.0)
Monocyte #: 1.1 x10 3/mm — ABNORMAL HIGH (ref 0.2–0.9)
Monocyte %: 9.9 %
Neutrophil %: 49 %
Platelet: 278 10*3/uL (ref 150–440)
RBC: 2.46 10*6/uL — ABNORMAL LOW (ref 3.80–5.20)
RDW: 15 % — ABNORMAL HIGH (ref 11.5–14.5)
WBC: 11.4 10*3/uL — ABNORMAL HIGH (ref 3.6–11.0)

## 2012-11-05 LAB — COMPREHENSIVE METABOLIC PANEL
Anion Gap: 8 (ref 7–16)
Chloride: 105 mmol/L (ref 98–107)
Co2: 22 mmol/L (ref 21–32)
Creatinine: 1.08 mg/dL (ref 0.60–1.30)
EGFR (Non-African Amer.): 57 — ABNORMAL LOW
Glucose: 100 mg/dL — ABNORMAL HIGH (ref 65–99)
Osmolality: 271 (ref 275–301)
Potassium: 4.5 mmol/L (ref 3.5–5.1)
SGOT(AST): 21 U/L (ref 15–37)
Total Protein: 5.8 g/dL — ABNORMAL LOW (ref 6.4–8.2)

## 2012-11-05 LAB — HEMOGLOBIN A1C: Hemoglobin A1C: 4.3 % (ref 4.2–6.3)

## 2012-11-05 LAB — TROPONIN I
Troponin-I: <0.02 ng/mL
Troponin-I: <0.02 ng/mL

## 2012-12-21 ENCOUNTER — Other Ambulatory Visit: Payer: Self-pay

## 2013-04-13 ENCOUNTER — Inpatient Hospital Stay: Payer: Self-pay | Admitting: Surgery

## 2013-04-13 LAB — CBC WITH DIFFERENTIAL/PLATELET
BASOS PCT: 0.9 %
Basophil #: 0.1 10*3/uL (ref 0.0–0.1)
EOS ABS: 0.1 10*3/uL (ref 0.0–0.7)
Eosinophil %: 1.3 %
HCT: 42.1 % (ref 35.0–47.0)
HGB: 14 g/dL (ref 12.0–16.0)
Lymphocyte #: 2 10*3/uL (ref 1.0–3.6)
Lymphocyte %: 27.3 %
MCH: 34.1 pg — ABNORMAL HIGH (ref 26.0–34.0)
MCHC: 33.3 g/dL (ref 32.0–36.0)
MCV: 103 fL — AB (ref 80–100)
Monocyte #: 0.8 x10 3/mm (ref 0.2–0.9)
Monocyte %: 10.6 %
NEUTROS PCT: 59.9 %
Neutrophil #: 4.3 10*3/uL (ref 1.4–6.5)
Platelet: 162 10*3/uL (ref 150–440)
RBC: 4.11 10*6/uL (ref 3.80–5.20)
RDW: 13.9 % (ref 11.5–14.5)
WBC: 7.2 10*3/uL (ref 3.6–11.0)

## 2013-04-13 LAB — URINALYSIS, COMPLETE
BACTERIA: NONE SEEN
Bilirubin,UR: NEGATIVE
Blood: NEGATIVE
Glucose,UR: NEGATIVE mg/dL (ref 0–75)
Ketone: NEGATIVE
Nitrite: NEGATIVE
PH: 6 (ref 4.5–8.0)
Protein: NEGATIVE
RBC,UR: 1 /HPF (ref 0–5)
Specific Gravity: 1.02 (ref 1.003–1.030)
WBC UR: 10 /HPF (ref 0–5)

## 2013-04-13 LAB — COMPREHENSIVE METABOLIC PANEL
ALK PHOS: 266 U/L — AB
ANION GAP: 7 (ref 7–16)
Albumin: 3.3 g/dL — ABNORMAL LOW (ref 3.4–5.0)
BUN: 20 mg/dL — AB (ref 7–18)
Bilirubin,Total: 2 mg/dL — ABNORMAL HIGH (ref 0.2–1.0)
CALCIUM: 8.9 mg/dL (ref 8.5–10.1)
Chloride: 99 mmol/L (ref 98–107)
Co2: 25 mmol/L (ref 21–32)
Creatinine: 1.2 mg/dL (ref 0.60–1.30)
EGFR (Non-African Amer.): 50 — ABNORMAL LOW
GFR CALC AF AMER: 58 — AB
GLUCOSE: 118 mg/dL — AB (ref 65–99)
OSMOLALITY: 266 (ref 275–301)
Potassium: 4.8 mmol/L (ref 3.5–5.1)
SGOT(AST): 140 U/L — ABNORMAL HIGH (ref 15–37)
SGPT (ALT): 141 U/L — ABNORMAL HIGH (ref 12–78)
SODIUM: 131 mmol/L — AB (ref 136–145)
Total Protein: 7.2 g/dL (ref 6.4–8.2)

## 2013-04-13 LAB — LIPASE, BLOOD: LIPASE: 728 U/L — AB (ref 73–393)

## 2013-04-14 LAB — COMPREHENSIVE METABOLIC PANEL
AST: 82 U/L — AB (ref 15–37)
Albumin: 2.5 g/dL — ABNORMAL LOW (ref 3.4–5.0)
Alkaline Phosphatase: 200 U/L — ABNORMAL HIGH
Anion Gap: 6 — ABNORMAL LOW (ref 7–16)
BUN: 18 mg/dL (ref 7–18)
Bilirubin,Total: 2.1 mg/dL — ABNORMAL HIGH (ref 0.2–1.0)
Calcium, Total: 7.9 mg/dL — ABNORMAL LOW (ref 8.5–10.1)
Chloride: 103 mmol/L (ref 98–107)
Co2: 25 mmol/L (ref 21–32)
Creatinine: 1.17 mg/dL (ref 0.60–1.30)
GFR CALC AF AMER: 60 — AB
GFR CALC NON AF AMER: 52 — AB
Glucose: 99 mg/dL (ref 65–99)
Osmolality: 270 (ref 275–301)
Potassium: 3.8 mmol/L (ref 3.5–5.1)
SGPT (ALT): 86 U/L — ABNORMAL HIGH (ref 12–78)
SODIUM: 134 mmol/L — AB (ref 136–145)
Total Protein: 5.5 g/dL — ABNORMAL LOW (ref 6.4–8.2)

## 2013-04-14 LAB — CBC WITH DIFFERENTIAL/PLATELET
BASOS PCT: 1.3 %
Basophil #: 0.1 10*3/uL (ref 0.0–0.1)
Eosinophil #: 0.1 10*3/uL (ref 0.0–0.7)
Eosinophil %: 1.6 %
HCT: 32.7 % — AB (ref 35.0–47.0)
HGB: 11.2 g/dL — AB (ref 12.0–16.0)
Lymphocyte #: 3.8 10*3/uL — ABNORMAL HIGH (ref 1.0–3.6)
Lymphocyte %: 41.6 %
MCH: 35.4 pg — ABNORMAL HIGH (ref 26.0–34.0)
MCHC: 34.1 g/dL (ref 32.0–36.0)
MCV: 104 fL — ABNORMAL HIGH (ref 80–100)
MONO ABS: 0.9 x10 3/mm (ref 0.2–0.9)
MONOS PCT: 9.5 %
NEUTROS ABS: 4.2 10*3/uL (ref 1.4–6.5)
NEUTROS PCT: 46 %
PLATELETS: 117 10*3/uL — AB (ref 150–440)
RBC: 3.16 10*6/uL — ABNORMAL LOW (ref 3.80–5.20)
RDW: 13.5 % (ref 11.5–14.5)
WBC: 9.2 10*3/uL (ref 3.6–11.0)

## 2013-04-15 LAB — CBC WITH DIFFERENTIAL/PLATELET
BASOS ABS: 0.1 10*3/uL (ref 0.0–0.1)
BASOS PCT: 1.1 %
Eosinophil #: 0.4 10*3/uL (ref 0.0–0.7)
Eosinophil %: 5 %
HCT: 30.2 % — ABNORMAL LOW (ref 35.0–47.0)
HGB: 10.2 g/dL — ABNORMAL LOW (ref 12.0–16.0)
LYMPHS PCT: 41.5 %
Lymphocyte #: 3.3 10*3/uL (ref 1.0–3.6)
MCH: 35.3 pg — AB (ref 26.0–34.0)
MCHC: 33.6 g/dL (ref 32.0–36.0)
MCV: 105 fL — ABNORMAL HIGH (ref 80–100)
MONO ABS: 0.7 x10 3/mm (ref 0.2–0.9)
Monocyte %: 8.6 %
Neutrophil #: 3.4 10*3/uL (ref 1.4–6.5)
Neutrophil %: 43.8 %
PLATELETS: 100 10*3/uL — AB (ref 150–440)
RBC: 2.88 10*6/uL — AB (ref 3.80–5.20)
RDW: 13.8 % (ref 11.5–14.5)
WBC: 7.9 10*3/uL (ref 3.6–11.0)

## 2013-04-15 LAB — COMPREHENSIVE METABOLIC PANEL
ALBUMIN: 2.1 g/dL — AB (ref 3.4–5.0)
Alkaline Phosphatase: 164 U/L — ABNORMAL HIGH
Anion Gap: 6 — ABNORMAL LOW (ref 7–16)
BUN: 8 mg/dL (ref 7–18)
Bilirubin,Total: 1.4 mg/dL — ABNORMAL HIGH (ref 0.2–1.0)
CALCIUM: 7.9 mg/dL — AB (ref 8.5–10.1)
Chloride: 108 mmol/L — ABNORMAL HIGH (ref 98–107)
Co2: 26 mmol/L (ref 21–32)
Creatinine: 1.14 mg/dL (ref 0.60–1.30)
GFR CALC NON AF AMER: 53 — AB
GLUCOSE: 84 mg/dL (ref 65–99)
OSMOLALITY: 277 (ref 275–301)
Potassium: 4.1 mmol/L (ref 3.5–5.1)
SGOT(AST): 48 U/L — ABNORMAL HIGH (ref 15–37)
SGPT (ALT): 57 U/L (ref 12–78)
Sodium: 140 mmol/L (ref 136–145)
Total Protein: 5 g/dL — ABNORMAL LOW (ref 6.4–8.2)

## 2013-04-16 LAB — CBC WITH DIFFERENTIAL/PLATELET
BASOS PCT: 0.4 %
Basophil #: 0 10*3/uL (ref 0.0–0.1)
Eosinophil #: 0.4 10*3/uL (ref 0.0–0.7)
Eosinophil %: 5.1 %
HCT: 28.2 % — ABNORMAL LOW (ref 35.0–47.0)
HGB: 9.5 g/dL — AB (ref 12.0–16.0)
LYMPHS PCT: 42.7 %
Lymphocyte #: 3.1 10*3/uL (ref 1.0–3.6)
MCH: 35.4 pg — ABNORMAL HIGH (ref 26.0–34.0)
MCHC: 33.5 g/dL (ref 32.0–36.0)
MCV: 106 fL — ABNORMAL HIGH (ref 80–100)
MONO ABS: 0.7 x10 3/mm (ref 0.2–0.9)
MONOS PCT: 10.4 %
Neutrophil #: 3 10*3/uL (ref 1.4–6.5)
Neutrophil %: 41.4 %
PLATELETS: 99 10*3/uL — AB (ref 150–440)
RBC: 2.67 10*6/uL — ABNORMAL LOW (ref 3.80–5.20)
RDW: 13.9 % (ref 11.5–14.5)
WBC: 7.2 10*3/uL (ref 3.6–11.0)

## 2013-04-16 LAB — COMPREHENSIVE METABOLIC PANEL
AST: 43 U/L — AB (ref 15–37)
Albumin: 2.2 g/dL — ABNORMAL LOW (ref 3.4–5.0)
Alkaline Phosphatase: 149 U/L — ABNORMAL HIGH
Anion Gap: 6 — ABNORMAL LOW (ref 7–16)
BILIRUBIN TOTAL: 0.9 mg/dL (ref 0.2–1.0)
BUN: 5 mg/dL — ABNORMAL LOW (ref 7–18)
CHLORIDE: 108 mmol/L — AB (ref 98–107)
Calcium, Total: 8.2 mg/dL — ABNORMAL LOW (ref 8.5–10.1)
Co2: 25 mmol/L (ref 21–32)
Creatinine: 1.02 mg/dL (ref 0.60–1.30)
EGFR (African American): >60
EGFR (Non-African Amer.): >60
Glucose: 87 mg/dL (ref 65–99)
OSMOLALITY: 274 (ref 275–301)
POTASSIUM: 4.1 mmol/L (ref 3.5–5.1)
SGPT (ALT): 48 U/L (ref 12–78)
Sodium: 139 mmol/L (ref 136–145)
Total Protein: 5.1 g/dL — ABNORMAL LOW (ref 6.4–8.2)

## 2013-04-17 LAB — COMPREHENSIVE METABOLIC PANEL
Albumin: 2.2 g/dL — ABNORMAL LOW (ref 3.4–5.0)
Alkaline Phosphatase: 138 U/L — ABNORMAL HIGH
Anion Gap: 3 — ABNORMAL LOW (ref 7–16)
BUN: 3 mg/dL — ABNORMAL LOW (ref 7–18)
Bilirubin,Total: 0.6 mg/dL (ref 0.2–1.0)
Calcium, Total: 8.3 mg/dL — ABNORMAL LOW (ref 8.5–10.1)
Chloride: 111 mmol/L — ABNORMAL HIGH (ref 98–107)
Co2: 26 mmol/L (ref 21–32)
Creatinine: 0.93 mg/dL (ref 0.60–1.30)
EGFR (African American): >60
EGFR (Non-African Amer.): >60
Glucose: 91 mg/dL (ref 65–99)
Osmolality: 276 (ref 275–301)
Potassium: 4.1 mmol/L (ref 3.5–5.1)
SGOT(AST): 41 U/L — ABNORMAL HIGH (ref 15–37)
SGPT (ALT): 48 U/L (ref 12–78)
Sodium: 140 mmol/L (ref 136–145)
Total Protein: 5.4 g/dL — ABNORMAL LOW (ref 6.4–8.2)

## 2013-06-28 ENCOUNTER — Inpatient Hospital Stay: Payer: Self-pay | Admitting: Internal Medicine

## 2013-06-28 LAB — CBC
HCT: 40.5 % (ref 35.0–47.0)
HGB: 13.8 g/dL (ref 12.0–16.0)
MCH: 37.4 pg — ABNORMAL HIGH (ref 26.0–34.0)
MCHC: 34 g/dL (ref 32.0–36.0)
MCV: 110 fL — ABNORMAL HIGH (ref 80–100)
PLATELETS: 157 10*3/uL (ref 150–440)
RBC: 3.68 10*6/uL — AB (ref 3.80–5.20)
RDW: 20.3 % — ABNORMAL HIGH (ref 11.5–14.5)
WBC: 11.1 10*3/uL — ABNORMAL HIGH (ref 3.6–11.0)

## 2013-06-28 LAB — LIPID PANEL
CHOLESTEROL: 368 mg/dL — AB (ref 0–200)
HDL Cholesterol: 19 mg/dL — ABNORMAL LOW (ref 40–60)
Triglycerides: 1174 mg/dL — ABNORMAL HIGH (ref 0–200)

## 2013-06-28 LAB — BASIC METABOLIC PANEL
Anion Gap: 12 (ref 7–16)
BUN: 14 mg/dL (ref 7–18)
Calcium, Total: 9.1 mg/dL (ref 8.5–10.1)
Chloride: 98 mmol/L (ref 98–107)
Co2: 21 mmol/L (ref 21–32)
Creatinine: 0.95 mg/dL (ref 0.60–1.30)
GLUCOSE: 111 mg/dL — AB (ref 65–99)
Osmolality: 264 (ref 275–301)
Potassium: 4.4 mmol/L (ref 3.5–5.1)
Sodium: 131 mmol/L — ABNORMAL LOW (ref 136–145)

## 2013-06-28 LAB — HEPATIC FUNCTION PANEL A (ARMC)
ALK PHOS: 435 U/L — AB
AST: 126 U/L — AB (ref 15–37)
Albumin: 3.3 g/dL — ABNORMAL LOW (ref 3.4–5.0)
BILIRUBIN DIRECT: 0.6 mg/dL — AB (ref 0.00–0.20)
Bilirubin,Total: 1.5 mg/dL — ABNORMAL HIGH (ref 0.2–1.0)
SGPT (ALT): 42 U/L (ref 12–78)
Total Protein: 7.8 g/dL (ref 6.4–8.2)

## 2013-06-28 LAB — TROPONIN I: Troponin-I: <0.02 ng/mL

## 2013-06-28 LAB — LIPASE, BLOOD: LIPASE: 2285 U/L — AB (ref 73–393)

## 2013-06-29 LAB — URINALYSIS, COMPLETE
BLOOD: NEGATIVE
Bacteria: NONE SEEN
Bilirubin,UR: NEGATIVE
GLUCOSE, UR: NEGATIVE mg/dL (ref 0–75)
Ketone: NEGATIVE
Nitrite: NEGATIVE
PROTEIN: NEGATIVE
Ph: 6 (ref 4.5–8.0)
SPECIFIC GRAVITY: 1.018 (ref 1.003–1.030)
WBC UR: 70 /HPF (ref 0–5)

## 2013-06-29 LAB — CBC WITH DIFFERENTIAL/PLATELET
BASOS PCT: 1.5 %
Basophil #: 0.1 10*3/uL (ref 0.0–0.1)
Eosinophil #: 0.4 10*3/uL (ref 0.0–0.7)
Eosinophil %: 3.6 %
HCT: 35.5 % (ref 35.0–47.0)
HGB: 11.9 g/dL — AB (ref 12.0–16.0)
Lymphocyte #: 4 10*3/uL — ABNORMAL HIGH (ref 1.0–3.6)
Lymphocyte %: 41.1 %
MCH: 37.8 pg — ABNORMAL HIGH (ref 26.0–34.0)
MCHC: 33.6 g/dL (ref 32.0–36.0)
MCV: 113 fL — AB (ref 80–100)
MONOS PCT: 9.2 %
Monocyte #: 0.9 x10 3/mm (ref 0.2–0.9)
NEUTROS ABS: 4.4 10*3/uL (ref 1.4–6.5)
Neutrophil %: 44.6 %
Platelet: 130 10*3/uL — ABNORMAL LOW (ref 150–440)
RBC: 3.16 10*6/uL — ABNORMAL LOW (ref 3.80–5.20)
RDW: 20.3 % — ABNORMAL HIGH (ref 11.5–14.5)
WBC: 9.8 10*3/uL (ref 3.6–11.0)

## 2013-06-29 LAB — HEMOGLOBIN: HGB: 12.5 g/dL (ref 12.0–16.0)

## 2013-06-29 LAB — COMPREHENSIVE METABOLIC PANEL
ALBUMIN: 2.5 g/dL — AB (ref 3.4–5.0)
AST: 94 U/L — AB (ref 15–37)
Alkaline Phosphatase: 321 U/L — ABNORMAL HIGH
Anion Gap: 9 (ref 7–16)
BILIRUBIN TOTAL: 1.3 mg/dL — AB (ref 0.2–1.0)
BUN: 14 mg/dL (ref 7–18)
CO2: 21 mmol/L (ref 21–32)
CREATININE: 1.15 mg/dL (ref 0.60–1.30)
Calcium, Total: 8.1 mg/dL — ABNORMAL LOW (ref 8.5–10.1)
Chloride: 107 mmol/L (ref 98–107)
EGFR (African American): >60
EGFR (Non-African Amer.): 53 — ABNORMAL LOW
Glucose: 96 mg/dL (ref 65–99)
Osmolality: 274 (ref 275–301)
Potassium: 3.8 mmol/L (ref 3.5–5.1)
SGPT (ALT): 30 U/L (ref 12–78)
SODIUM: 137 mmol/L (ref 136–145)
Total Protein: 6.4 g/dL (ref 6.4–8.2)

## 2013-06-29 LAB — TROPONIN I: Troponin-I: <0.02 ng/mL

## 2013-06-29 LAB — MAGNESIUM: Magnesium: 1.5 mg/dL — ABNORMAL LOW

## 2013-06-29 LAB — LIPASE, BLOOD: Lipase: 556 U/L — ABNORMAL HIGH (ref 73–393)

## 2013-06-30 LAB — CBC WITH DIFFERENTIAL/PLATELET
BASOS ABS: 0.1 10*3/uL (ref 0.0–0.1)
Basophil %: 1 %
EOS ABS: 0.5 10*3/uL (ref 0.0–0.7)
Eosinophil %: 4.2 %
HCT: 33.7 % — AB (ref 35.0–47.0)
HGB: 11.2 g/dL — ABNORMAL LOW (ref 12.0–16.0)
Lymphocyte #: 3.5 10*3/uL (ref 1.0–3.6)
Lymphocyte %: 28.7 %
MCH: 37.7 pg — ABNORMAL HIGH (ref 26.0–34.0)
MCHC: 33.2 g/dL (ref 32.0–36.0)
MCV: 113 fL — ABNORMAL HIGH (ref 80–100)
MONOS PCT: 10.6 %
Monocyte #: 1.3 x10 3/mm — ABNORMAL HIGH (ref 0.2–0.9)
NEUTROS ABS: 6.7 10*3/uL — AB (ref 1.4–6.5)
NEUTROS PCT: 55.5 %
PLATELETS: 116 10*3/uL — AB (ref 150–440)
RBC: 2.97 10*6/uL — AB (ref 3.80–5.20)
RDW: 20.5 % — ABNORMAL HIGH (ref 11.5–14.5)
WBC: 12 10*3/uL — ABNORMAL HIGH (ref 3.6–11.0)

## 2013-06-30 LAB — BASIC METABOLIC PANEL
ANION GAP: 5 — AB (ref 7–16)
BUN: 8 mg/dL (ref 7–18)
CHLORIDE: 110 mmol/L — AB (ref 98–107)
CREATININE: 1.12 mg/dL (ref 0.60–1.30)
Calcium, Total: 8 mg/dL — ABNORMAL LOW (ref 8.5–10.1)
Co2: 22 mmol/L (ref 21–32)
EGFR (Non-African Amer.): 54 — ABNORMAL LOW
GLUCOSE: 87 mg/dL (ref 65–99)
OSMOLALITY: 272 (ref 275–301)
Potassium: 3.9 mmol/L (ref 3.5–5.1)
SODIUM: 137 mmol/L (ref 136–145)

## 2013-06-30 LAB — LIPASE, BLOOD: Lipase: 335 U/L (ref 73–393)

## 2013-07-03 ENCOUNTER — Emergency Department: Payer: Self-pay | Admitting: Emergency Medicine

## 2013-07-03 LAB — COMPREHENSIVE METABOLIC PANEL
ALBUMIN: 2.6 g/dL — AB (ref 3.4–5.0)
ALT: 28 U/L (ref 12–78)
Alkaline Phosphatase: 254 U/L — ABNORMAL HIGH
Anion Gap: 12 (ref 7–16)
BUN: 8 mg/dL (ref 7–18)
Bilirubin,Total: 0.5 mg/dL (ref 0.2–1.0)
CALCIUM: 8 mg/dL — AB (ref 8.5–10.1)
CO2: 20 mmol/L — AB (ref 21–32)
CREATININE: 1.07 mg/dL (ref 0.60–1.30)
Chloride: 109 mmol/L — ABNORMAL HIGH (ref 98–107)
EGFR (African American): >60
EGFR (Non-African Amer.): 58 — ABNORMAL LOW
Glucose: 134 mg/dL — ABNORMAL HIGH (ref 65–99)
Osmolality: 282 (ref 275–301)
POTASSIUM: 3.4 mmol/L — AB (ref 3.5–5.1)
SGOT(AST): 65 U/L — ABNORMAL HIGH (ref 15–37)
SODIUM: 141 mmol/L (ref 136–145)
Total Protein: 6.4 g/dL (ref 6.4–8.2)

## 2013-07-03 LAB — CBC
HCT: 31.6 % — AB (ref 35.0–47.0)
HGB: 10.8 g/dL — ABNORMAL LOW (ref 12.0–16.0)
MCH: 38.8 pg — AB (ref 26.0–34.0)
MCHC: 34.2 g/dL (ref 32.0–36.0)
MCV: 114 fL — ABNORMAL HIGH (ref 80–100)
Platelet: 155 10*3/uL (ref 150–440)
RBC: 2.78 10*6/uL — ABNORMAL LOW (ref 3.80–5.20)
RDW: 20.8 % — ABNORMAL HIGH (ref 11.5–14.5)
WBC: 12.9 10*3/uL — AB (ref 3.6–11.0)

## 2013-07-03 LAB — TROPONIN I: Troponin-I: <0.02 ng/mL

## 2013-07-03 LAB — CK TOTAL AND CKMB (NOT AT ARMC)
CK, Total: 64 U/L
CK-MB: 1 ng/mL (ref 0.5–3.6)

## 2013-07-03 LAB — ETHANOL
ETHANOL %: 0.275 % — AB (ref 0.000–0.080)
Ethanol: 275 mg/dL

## 2013-07-03 LAB — APTT: ACTIVATED PTT: 32 s (ref 23.6–35.9)

## 2013-07-03 LAB — LIPASE, BLOOD: Lipase: 498 U/L — ABNORMAL HIGH (ref 73–393)

## 2013-07-03 LAB — PROTIME-INR
INR: 1
Prothrombin Time: 13.5 secs (ref 11.5–14.7)

## 2013-07-23 ENCOUNTER — Emergency Department: Payer: Self-pay | Admitting: Emergency Medicine

## 2013-07-23 LAB — COMPREHENSIVE METABOLIC PANEL
AST: 74 U/L — AB (ref 15–37)
Albumin: 3.6 g/dL (ref 3.4–5.0)
Alkaline Phosphatase: 205 U/L — ABNORMAL HIGH
Anion Gap: 8 (ref 7–16)
BUN: 7 mg/dL (ref 7–18)
Bilirubin,Total: 2.2 mg/dL — ABNORMAL HIGH (ref 0.2–1.0)
CALCIUM: 9 mg/dL (ref 8.5–10.1)
CREATININE: 0.84 mg/dL (ref 0.60–1.30)
Chloride: 100 mmol/L (ref 98–107)
Co2: 24 mmol/L (ref 21–32)
EGFR (African American): >60
EGFR (Non-African Amer.): >60
Glucose: 120 mg/dL — ABNORMAL HIGH (ref 65–99)
Osmolality: 264 (ref 275–301)
POTASSIUM: 3.2 mmol/L — AB (ref 3.5–5.1)
SGPT (ALT): 23 U/L (ref 12–78)
SODIUM: 132 mmol/L — AB (ref 136–145)
Total Protein: 8.1 g/dL (ref 6.4–8.2)

## 2013-07-23 LAB — CBC WITH DIFFERENTIAL/PLATELET
BASOS ABS: 0.1 10*3/uL (ref 0.0–0.1)
Basophil %: 1 %
Eosinophil #: 0.1 10*3/uL (ref 0.0–0.7)
Eosinophil %: 0.8 %
HCT: 37 % (ref 35.0–47.0)
HGB: 12.2 g/dL (ref 12.0–16.0)
Lymphocyte #: 3 10*3/uL (ref 1.0–3.6)
Lymphocyte %: 25.8 %
MCH: 36.8 pg — AB (ref 26.0–34.0)
MCHC: 32.9 g/dL (ref 32.0–36.0)
MCV: 112 fL — ABNORMAL HIGH (ref 80–100)
MONO ABS: 0.9 x10 3/mm (ref 0.2–0.9)
Monocyte %: 7.8 %
Neutrophil #: 7.6 10*3/uL — ABNORMAL HIGH (ref 1.4–6.5)
Neutrophil %: 64.6 %
PLATELETS: 126 10*3/uL — AB (ref 150–440)
RBC: 3.31 10*6/uL — AB (ref 3.80–5.20)
RDW: 17.8 % — ABNORMAL HIGH (ref 11.5–14.5)
WBC: 11.7 10*3/uL — AB (ref 3.6–11.0)

## 2013-07-23 LAB — URINALYSIS, COMPLETE
BILIRUBIN, UR: NEGATIVE
Glucose,UR: NEGATIVE mg/dL (ref 0–75)
KETONE: NEGATIVE
NITRITE: POSITIVE
Ph: 6 (ref 4.5–8.0)
RBC,UR: 9 /HPF (ref 0–5)
Specific Gravity: 1.02 (ref 1.003–1.030)

## 2013-07-23 LAB — LIPASE, BLOOD: Lipase: 273 U/L (ref 73–393)

## 2013-07-23 LAB — TROPONIN I: Troponin-I: <0.02 ng/mL

## 2013-07-23 LAB — ETHANOL: Ethanol: <3 mg/dL

## 2013-07-25 LAB — URINE CULTURE

## 2013-08-06 ENCOUNTER — Emergency Department: Payer: Self-pay | Admitting: Emergency Medicine

## 2013-08-07 LAB — URINALYSIS, COMPLETE
Bilirubin,UR: NEGATIVE
Glucose,UR: NEGATIVE mg/dL (ref 0–75)
Hyaline Cast: 2
KETONE: NEGATIVE
Nitrite: POSITIVE
Ph: 7 (ref 4.5–8.0)
Protein: 30
Specific Gravity: 1.01 (ref 1.003–1.030)

## 2013-08-07 LAB — COMPREHENSIVE METABOLIC PANEL
ALBUMIN: 3.5 g/dL (ref 3.4–5.0)
ALK PHOS: 216 U/L — AB
ANION GAP: 18 — AB (ref 7–16)
BUN: 8 mg/dL (ref 7–18)
Bilirubin,Total: 1.1 mg/dL — ABNORMAL HIGH (ref 0.2–1.0)
CHLORIDE: 101 mmol/L (ref 98–107)
CO2: 17 mmol/L — AB (ref 21–32)
Calcium, Total: 8.4 mg/dL — ABNORMAL LOW (ref 8.5–10.1)
Creatinine: 0.81 mg/dL (ref 0.60–1.30)
EGFR (Non-African Amer.): >60
Glucose: 93 mg/dL (ref 65–99)
Osmolality: 270 (ref 275–301)
Potassium: 3.4 mmol/L — ABNORMAL LOW (ref 3.5–5.1)
SGOT(AST): 129 U/L — ABNORMAL HIGH (ref 15–37)
SGPT (ALT): 33 U/L (ref 12–78)
Sodium: 136 mmol/L (ref 136–145)
Total Protein: 7.6 g/dL (ref 6.4–8.2)

## 2013-08-07 LAB — CBC
HCT: 33.9 % — ABNORMAL LOW (ref 35.0–47.0)
HGB: 11.4 g/dL — ABNORMAL LOW (ref 12.0–16.0)
MCH: 38.2 pg — ABNORMAL HIGH (ref 26.0–34.0)
MCHC: 33.7 g/dL (ref 32.0–36.0)
MCV: 114 fL — ABNORMAL HIGH (ref 80–100)
Platelet: 80 10*3/uL — ABNORMAL LOW (ref 150–440)
RBC: 2.98 10*6/uL — ABNORMAL LOW (ref 3.80–5.20)
RDW: 17.9 % — AB (ref 11.5–14.5)
WBC: 11.4 10*3/uL — AB (ref 3.6–11.0)

## 2013-08-07 LAB — ETHANOL
Ethanol %: 0.254 % — ABNORMAL HIGH (ref 0.000–0.080)
Ethanol: 254 mg/dL

## 2013-08-07 LAB — CK TOTAL AND CKMB (NOT AT ARMC)
CK, Total: 88 U/L
CK-MB: 1.5 ng/mL (ref 0.5–3.6)

## 2013-08-07 LAB — TROPONIN I

## 2013-10-23 ENCOUNTER — Inpatient Hospital Stay: Payer: Self-pay | Admitting: Orthopedic Surgery

## 2013-10-23 ENCOUNTER — Ambulatory Visit: Payer: Self-pay | Admitting: Orthopedic Surgery

## 2013-10-23 LAB — DRUG SCREEN, URINE
Amphetamines, Ur Screen: NEGATIVE (ref ?–1000)
BARBITURATES, UR SCREEN: NEGATIVE (ref ?–200)
Benzodiazepine, Ur Scrn: NEGATIVE (ref ?–200)
COCAINE METABOLITE, UR ~~LOC~~: NEGATIVE (ref ?–300)
Cannabinoid 50 Ng, Ur ~~LOC~~: NEGATIVE (ref ?–50)
MDMA (ECSTASY) UR SCREEN: NEGATIVE (ref ?–500)
Methadone, Ur Screen: NEGATIVE (ref ?–300)
Opiate, Ur Screen: POSITIVE (ref ?–300)
Phencyclidine (PCP) Ur S: NEGATIVE (ref ?–25)
Tricyclic, Ur Screen: NEGATIVE (ref ?–1000)

## 2013-10-23 LAB — LIPID PANEL
Cholesterol: 148 mg/dL (ref 0–200)
HDL Cholesterol: 32 mg/dL — ABNORMAL LOW (ref 40–60)
Ldl Cholesterol, Calc: 86 mg/dL (ref 0–100)
Triglycerides: 150 mg/dL (ref 0–200)
VLDL Cholesterol, Calc: 30 mg/dL (ref 5–40)

## 2013-10-23 LAB — URINALYSIS, COMPLETE
BACTERIA: NONE SEEN
Bilirubin,UR: NEGATIVE
Glucose,UR: NEGATIVE mg/dL (ref 0–75)
KETONE: NEGATIVE
Nitrite: NEGATIVE
PH: 6 (ref 4.5–8.0)
PROTEIN: NEGATIVE
RBC,UR: <1 /HPF (ref 0–5)
SPECIFIC GRAVITY: 1.002 (ref 1.003–1.030)
SQUAMOUS EPITHELIAL: NONE SEEN
WBC UR: 4 /HPF (ref 0–5)

## 2013-10-23 LAB — BASIC METABOLIC PANEL
ANION GAP: 12 (ref 7–16)
ANION GAP: 10 (ref 7–16)
BUN: 11 mg/dL (ref 7–18)
BUN: 9 mg/dL (ref 7–18)
CALCIUM: 7.4 mg/dL — AB (ref 8.5–10.1)
CHLORIDE: 100 mmol/L (ref 98–107)
CO2: 16 mmol/L — AB (ref 21–32)
CREATININE: 1.31 mg/dL — AB (ref 0.60–1.30)
Calcium, Total: 8.1 mg/dL — ABNORMAL LOW (ref 8.5–10.1)
Chloride: 112 mmol/L — ABNORMAL HIGH (ref 98–107)
Co2: 16 mmol/L — ABNORMAL LOW (ref 21–32)
Creatinine: 1.38 mg/dL — ABNORMAL HIGH (ref 0.60–1.30)
EGFR (African American): 49 — ABNORMAL LOW
EGFR (African American): 52 — ABNORMAL LOW
EGFR (Non-African Amer.): 42 — ABNORMAL LOW
EGFR (Non-African Amer.): 45 — ABNORMAL LOW
GLUCOSE: 101 mg/dL — AB (ref 65–99)
Glucose: 99 mg/dL (ref 65–99)
OSMOLALITY: 257 (ref 275–301)
Osmolality: 274 (ref 275–301)
POTASSIUM: 2.8 mmol/L — AB (ref 3.5–5.1)
POTASSIUM: 3 mmol/L — AB (ref 3.5–5.1)
SODIUM: 138 mmol/L (ref 136–145)
Sodium: 128 mmol/L — ABNORMAL LOW (ref 136–145)

## 2013-10-23 LAB — CBC WITH DIFFERENTIAL/PLATELET
BASOS ABS: 0.1 10*3/uL (ref 0.0–0.1)
BASOS PCT: 0.3 %
Eosinophil #: 0.4 10*3/uL (ref 0.0–0.7)
Eosinophil %: 2.1 %
HCT: 34.4 % — ABNORMAL LOW (ref 35.0–47.0)
HGB: 11.4 g/dL — AB (ref 12.0–16.0)
LYMPHS PCT: 9.9 %
Lymphocyte #: 1.8 10*3/uL (ref 1.0–3.6)
MCH: 40.2 pg — ABNORMAL HIGH (ref 26.0–34.0)
MCHC: 33.1 g/dL (ref 32.0–36.0)
MCV: 121 fL — ABNORMAL HIGH (ref 80–100)
MONO ABS: 1.2 x10 3/mm — AB (ref 0.2–0.9)
MONOS PCT: 6.9 %
NEUTROS ABS: 14.5 10*3/uL — AB (ref 1.4–6.5)
Neutrophil %: 80.8 %
PLATELETS: 149 10*3/uL — AB (ref 150–440)
RBC: 2.83 10*6/uL — AB (ref 3.80–5.20)
RDW: 18.7 % — AB (ref 11.5–14.5)
WBC: 17.9 10*3/uL — ABNORMAL HIGH (ref 3.6–11.0)

## 2013-10-23 LAB — HEPATIC FUNCTION PANEL A (ARMC)
Albumin: 2.9 g/dL — ABNORMAL LOW (ref 3.4–5.0)
Alkaline Phosphatase: 192 U/L — ABNORMAL HIGH
Bilirubin, Direct: 0.1 mg/dL (ref 0.00–0.20)
Bilirubin,Total: 0.8 mg/dL (ref 0.2–1.0)
SGOT(AST): 63 U/L — ABNORMAL HIGH (ref 15–37)
SGPT (ALT): 30 U/L
Total Protein: 6.4 g/dL (ref 6.4–8.2)

## 2013-10-23 LAB — PREGNANCY, URINE: Pregnancy Test, Urine: NEGATIVE m[IU]/mL

## 2013-10-24 LAB — BASIC METABOLIC PANEL
Anion Gap: 11 (ref 7–16)
BUN: 6 mg/dL — ABNORMAL LOW (ref 7–18)
Calcium, Total: 6.9 mg/dL — CL (ref 8.5–10.1)
Chloride: 113 mmol/L — ABNORMAL HIGH (ref 98–107)
Co2: 14 mmol/L — ABNORMAL LOW (ref 21–32)
Creatinine: 1.24 mg/dL (ref 0.60–1.30)
EGFR (African American): 56 — ABNORMAL LOW
EGFR (Non-African Amer.): 48 — ABNORMAL LOW
Glucose: 122 mg/dL — ABNORMAL HIGH (ref 65–99)
Osmolality: 275 (ref 275–301)
Potassium: 3.7 mmol/L (ref 3.5–5.1)
Sodium: 138 mmol/L (ref 136–145)

## 2013-10-24 LAB — TSH: Thyroid Stimulating Horm: 0.38 u[IU]/mL — ABNORMAL LOW

## 2013-10-24 LAB — HEMOGLOBIN
HGB: 7.9 g/dL — ABNORMAL LOW (ref 12.0–16.0)
HGB: 7.9 g/dL — AB (ref 12.0–16.0)

## 2013-10-24 LAB — HEMOGLOBIN A1C: Hemoglobin A1C: 6 % (ref 4.2–6.3)

## 2013-10-24 LAB — PLATELET COUNT: Platelet: 108 10*3/uL — ABNORMAL LOW (ref 150–440)

## 2013-10-25 LAB — CBC WITH DIFFERENTIAL/PLATELET
Basophil #: 0.2 10*3/uL — ABNORMAL HIGH (ref 0.0–0.1)
Basophil %: 1.1 %
EOS PCT: 2.5 %
Eosinophil #: 0.5 10*3/uL (ref 0.0–0.7)
HCT: 22.7 % — ABNORMAL LOW (ref 35.0–47.0)
HGB: 7.2 g/dL — ABNORMAL LOW (ref 12.0–16.0)
LYMPHS PCT: 13.6 %
Lymphocyte #: 3 10*3/uL (ref 1.0–3.6)
MCH: 39.4 pg — ABNORMAL HIGH (ref 26.0–34.0)
MCHC: 31.6 g/dL — AB (ref 32.0–36.0)
MCV: 124 fL — ABNORMAL HIGH (ref 80–100)
Monocyte #: 2.3 x10 3/mm — ABNORMAL HIGH (ref 0.2–0.9)
Monocyte %: 10.3 %
NEUTROS PCT: 72.5 %
Neutrophil #: 15.9 10*3/uL — ABNORMAL HIGH (ref 1.4–6.5)
Platelet: 113 10*3/uL — ABNORMAL LOW (ref 150–440)
RBC: 1.82 10*6/uL — ABNORMAL LOW (ref 3.80–5.20)
RDW: 18.6 % — ABNORMAL HIGH (ref 11.5–14.5)
WBC: 22 10*3/uL — ABNORMAL HIGH (ref 3.6–11.0)

## 2013-10-25 LAB — BASIC METABOLIC PANEL
ANION GAP: 7 (ref 7–16)
BUN: 6 mg/dL — AB (ref 7–18)
CALCIUM: 7.3 mg/dL — AB (ref 8.5–10.1)
CHLORIDE: 113 mmol/L — AB (ref 98–107)
Co2: 16 mmol/L — ABNORMAL LOW (ref 21–32)
Creatinine: 1.07 mg/dL (ref 0.60–1.30)
EGFR (African American): >60
EGFR (Non-African Amer.): 58 — ABNORMAL LOW
GLUCOSE: 109 mg/dL — AB (ref 65–99)
OSMOLALITY: 270 (ref 275–301)
Potassium: 5.5 mmol/L — ABNORMAL HIGH (ref 3.5–5.1)
SODIUM: 136 mmol/L (ref 136–145)

## 2013-10-25 LAB — CLOSTRIDIUM DIFFICILE(ARMC)

## 2013-10-25 LAB — T4, FREE: Free Thyroxine: 1 ng/dL (ref 0.76–1.46)

## 2013-10-25 LAB — POTASSIUM: POTASSIUM: 4.2 mmol/L (ref 3.5–5.1)

## 2013-10-26 LAB — BASIC METABOLIC PANEL
Anion Gap: 7 (ref 7–16)
BUN: 10 mg/dL (ref 7–18)
CALCIUM: 8.2 mg/dL — AB (ref 8.5–10.1)
CO2: 23 mmol/L (ref 21–32)
Chloride: 107 mmol/L (ref 98–107)
Creatinine: 1.06 mg/dL (ref 0.60–1.30)
EGFR (African American): >60
EGFR (Non-African Amer.): 58 — ABNORMAL LOW
GLUCOSE: 129 mg/dL — AB (ref 65–99)
Osmolality: 275 (ref 275–301)
Potassium: 4.5 mmol/L (ref 3.5–5.1)
Sodium: 137 mmol/L (ref 136–145)

## 2013-10-26 LAB — CBC WITH DIFFERENTIAL/PLATELET
Basophil #: 0.1 10*3/uL (ref 0.0–0.1)
Basophil %: 0.7 %
Eosinophil #: 0.8 10*3/uL — ABNORMAL HIGH (ref 0.0–0.7)
Eosinophil %: 4.6 %
HCT: 25.4 % — ABNORMAL LOW (ref 35.0–47.0)
HGB: 8.2 g/dL — ABNORMAL LOW (ref 12.0–16.0)
Lymphocyte #: 2.5 10*3/uL (ref 1.0–3.6)
Lymphocyte %: 13.9 %
MCH: 37.9 pg — ABNORMAL HIGH (ref 26.0–34.0)
MCHC: 32.3 g/dL (ref 32.0–36.0)
MCV: 117 fL — ABNORMAL HIGH (ref 80–100)
Monocyte #: 2.3 x10 3/mm — ABNORMAL HIGH (ref 0.2–0.9)
Monocyte %: 12.8 %
Neutrophil #: 12 10*3/uL — ABNORMAL HIGH (ref 1.4–6.5)
Neutrophil %: 68 %
Platelet: 130 10*3/uL — ABNORMAL LOW (ref 150–440)
RBC: 2.17 10*6/uL — ABNORMAL LOW (ref 3.80–5.20)
RDW: 23.5 % — ABNORMAL HIGH (ref 11.5–14.5)
WBC: 17.7 10*3/uL — ABNORMAL HIGH (ref 3.6–11.0)

## 2013-10-26 LAB — PATHOLOGY REPORT

## 2013-10-31 ENCOUNTER — Telehealth: Payer: Self-pay | Admitting: Internal Medicine

## 2013-10-31 NOTE — Telephone Encounter (Signed)
Please advise 

## 2013-10-31 NOTE — Telephone Encounter (Signed)
Teresa Copeland from [facility] called in and stated pt is a new resident to the facility and is needing to be seen within 1-2 weeks. Was given an appt of 10/20. She was wondering does she need to be seen sooner. Please advise.

## 2013-10-31 NOTE — Telephone Encounter (Signed)
Why was she admitted to senior care?

## 2013-11-01 NOTE — Telephone Encounter (Signed)
Please schedule appt per Dr Walkers note 

## 2013-11-01 NOTE — Telephone Encounter (Signed)
OK. Next available follow up.

## 2013-11-01 NOTE — Telephone Encounter (Signed)
Hospital discharge in green folder for you

## 2013-11-15 ENCOUNTER — Emergency Department: Payer: Self-pay | Admitting: Emergency Medicine

## 2013-11-15 LAB — CBC
HCT: 34 % — AB (ref 35.0–47.0)
HGB: 10.6 g/dL — ABNORMAL LOW (ref 12.0–16.0)
MCH: 34 pg (ref 26.0–34.0)
MCHC: 31 g/dL — AB (ref 32.0–36.0)
MCV: 110 fL — ABNORMAL HIGH (ref 80–100)
PLATELETS: 301 10*3/uL (ref 150–440)
RBC: 3.11 10*6/uL — ABNORMAL LOW (ref 3.80–5.20)
RDW: 17.3 % — ABNORMAL HIGH (ref 11.5–14.5)
WBC: 12.7 10*3/uL — AB (ref 3.6–11.0)

## 2013-11-15 LAB — COMPREHENSIVE METABOLIC PANEL
ALBUMIN: 2.4 g/dL — AB (ref 3.4–5.0)
ALK PHOS: 166 U/L — AB
ANION GAP: 7 (ref 7–16)
AST: 48 U/L — AB (ref 15–37)
BILIRUBIN TOTAL: 0.5 mg/dL (ref 0.2–1.0)
BUN: 4 mg/dL — ABNORMAL LOW (ref 7–18)
CALCIUM: 8.2 mg/dL — AB (ref 8.5–10.1)
Chloride: 110 mmol/L — ABNORMAL HIGH (ref 98–107)
Co2: 21 mmol/L (ref 21–32)
Creatinine: 0.77 mg/dL (ref 0.60–1.30)
EGFR (Non-African Amer.): >60
Glucose: 96 mg/dL (ref 65–99)
Osmolality: 272 (ref 275–301)
Potassium: 3.7 mmol/L (ref 3.5–5.1)
SGPT (ALT): 18 U/L
Sodium: 138 mmol/L (ref 136–145)
Total Protein: 6 g/dL — ABNORMAL LOW (ref 6.4–8.2)

## 2013-11-15 LAB — PROTIME-INR
INR: 2.4
PROTHROMBIN TIME: 25.2 s — AB (ref 11.5–14.7)

## 2013-12-04 ENCOUNTER — Encounter: Payer: Self-pay | Admitting: Internal Medicine

## 2013-12-04 ENCOUNTER — Telehealth: Payer: Self-pay | Admitting: Internal Medicine

## 2013-12-04 ENCOUNTER — Ambulatory Visit (INDEPENDENT_AMBULATORY_CARE_PROVIDER_SITE_OTHER): Payer: BC Managed Care – PPO | Admitting: Internal Medicine

## 2013-12-04 VITALS — BP 140/98 | HR 90 | Temp 98.1°F | Wt 141.8 lb

## 2013-12-04 DIAGNOSIS — N183 Chronic kidney disease, stage 3 unspecified: Secondary | ICD-10-CM

## 2013-12-04 DIAGNOSIS — S81801S Unspecified open wound, right lower leg, sequela: Secondary | ICD-10-CM

## 2013-12-04 DIAGNOSIS — F1010 Alcohol abuse, uncomplicated: Secondary | ICD-10-CM

## 2013-12-04 DIAGNOSIS — R296 Repeated falls: Secondary | ICD-10-CM

## 2013-12-04 DIAGNOSIS — S81801A Unspecified open wound, right lower leg, initial encounter: Secondary | ICD-10-CM | POA: Insufficient documentation

## 2013-12-04 LAB — CBC
HEMATOCRIT: 36.7 % (ref 36.0–46.0)
HEMOGLOBIN: 11.9 g/dL — AB (ref 12.0–15.0)
MCHC: 32.3 g/dL (ref 30.0–36.0)
MCV: 101.4 fl — AB (ref 78.0–100.0)
PLATELETS: 161 10*3/uL (ref 150.0–400.0)
RBC: 3.62 Mil/uL — AB (ref 3.87–5.11)
RDW: 17.9 % — ABNORMAL HIGH (ref 11.5–15.5)
WBC: 10.5 10*3/uL (ref 4.0–10.5)

## 2013-12-04 LAB — COMPREHENSIVE METABOLIC PANEL
ALK PHOS: 199 U/L — AB (ref 39–117)
ALT: 21 U/L (ref 0–35)
AST: 68 U/L — AB (ref 0–37)
Albumin: 2.7 g/dL — ABNORMAL LOW (ref 3.5–5.2)
BILIRUBIN TOTAL: 1.1 mg/dL (ref 0.2–1.2)
BUN: 5 mg/dL — AB (ref 6–23)
CO2: 18 mEq/L — ABNORMAL LOW (ref 19–32)
CREATININE: 0.9 mg/dL (ref 0.4–1.2)
Calcium: 8.8 mg/dL (ref 8.4–10.5)
Chloride: 100 mEq/L (ref 96–112)
GFR: 65.79 mL/min (ref >60.00)
Glucose, Bld: 118 mg/dL — ABNORMAL HIGH (ref 70–99)
Potassium: 3.5 mEq/L (ref 3.5–5.1)
Sodium: 131 mEq/L — ABNORMAL LOW (ref 135–145)
Total Protein: 6.6 g/dL (ref 6.0–8.3)

## 2013-12-04 LAB — HEMOGLOBIN A1C: Hgb A1c MFr Bld: 4.7 % (ref 4.6–6.5)

## 2013-12-04 NOTE — Progress Notes (Signed)
Pre visit review using our clinic review tool, if applicable. No additional management support is needed unless otherwise documented below in the visit note. 

## 2013-12-04 NOTE — Telephone Encounter (Signed)
Pt need 2wk recheck . Please advise where to add to schedule/msn

## 2013-12-04 NOTE — Telephone Encounter (Signed)
Nov 5th at [facility]4pm

## 2013-12-04 NOTE — Assessment & Plan Note (Signed)
Recurrent falls related to alcohol abuse and gait instability. Would like to stop Xarelto, however not clear why this medication was started. [facility] to determine why Xarelto was started.

## 2013-12-04 NOTE — Assessment & Plan Note (Signed)
Right posterior leg wound. Will set up evaluation at [facility]. Continue dry dressing for now. She repeatedly requests medication for pain, however it appears she received Dilaud[facility] from [facility]. She has history of narcotic and alcohol abuse. No narcotic prescriptions from our office. Recommended she limit use of Advil. Use Tylenol 1000mg  po bid prn pain.

## 2013-12-04 NOTE — Assessment & Plan Note (Signed)
Persistent ongoing alcohol use. Now living at son's home. Discussed risks of repeated falls with ongoing alcohol use.

## 2013-12-04 NOTE — Assessment & Plan Note (Signed)
Repeat Renal function with labs today. 

## 2013-12-04 NOTE — Patient Instructions (Signed)
We will set up an evaluation at [facility].  Labs today.  Follow up in 4 weeks.

## 2013-12-04 NOTE — Progress Notes (Signed)
Subjective:    Patient ID: Grayling Congressebra L Shell, female    DOB: 02/03/1956, 58 y.o.   MRN: [id number]  HPI 58YO female history of alcohol and narcotic abuse, splenic rupture requiring splenectomy, tobacco abuse and COPD, hypertension presents for follow up. She was last seen in our office in 04/2012.  Right leg wound - Fell at home a couple of months ago. Had laceration on leg. Fractured hip. Continues to drink alcohol at home. Was admitted to [facility] and they treated wound daily. Arrived home 2-3 weeks ago. Has been cleaning wound herself. Some chills noted at home. Requests medication for pain. Has been "eating Advil like candy." On Clindamycin.  Started on Xarelto, but not sure why. [facility] from [facility] regarding this. Reviewed last d/c summary from [facility], noting that pt was on Lovenox for DVT prevention but no mention of long-term xarelto. Reviewed note from 9/25 from Dr. Rosita KeaMenz, no mention of Xarelto.   Review of Systems  Constitutional: Negative for fever, chills, appetite change, fatigue and unexpected weight change.  Eyes: Negative for visual disturbance.  Respiratory: Negative for shortness of breath.   Cardiovascular: Negative for chest pain and leg swelling.  Gastrointestinal: Negative for abdominal pain.  Musculoskeletal: Positive for arthralgias and myalgias.  Skin: Positive for color change and wound. Negative for rash.  Hematological: Negative for adenopathy. Does not bruise/bleed easily.  Psychiatric/Behavioral: Positive for behavioral problems, confusion, sleep disturbance and decreased concentration. Negative for suicidal ideas and dysphoric mood. The patient is not nervous/anxious.        Objective:    BP 140/98  Pulse 90  Temp(Src) 98.1 F (36.7 C) (Oral)  Wt 141 lb 12 oz (64.297 kg)  SpO2 98% Physical Exam  Constitutional: She is oriented to person, place, and time. She appears well-developed and well-nourished. No distress.  HENT:  Head: Normocephalic and  atraumatic.  Right Ear: External ear normal.  Left Ear: External ear normal.  Nose: Nose normal.  Mouth/Throat: Oropharynx is clear and moist. No oropharyngeal exudate.  Eyes: Conjunctivae are normal. Pupils are equal, round, and reactive to light. Right eye exhibits no discharge. Left eye exhibits no discharge. No scleral icterus.  Neck: Normal range of motion. Neck supple. No tracheal deviation present. No thyromegaly present.  Cardiovascular: Normal rate, regular rhythm, normal heart sounds and intact distal pulses.  Exam reveals no gallop and no friction rub.   No murmur heard. Pulmonary/Chest: Effort normal and breath sounds normal. No accessory muscle usage. Not tachypneic. No respiratory distress. She has no decreased breath sounds. She has no wheezes. She has no rhonchi. She has no rales. She exhibits no tenderness.  Musculoskeletal: Normal range of motion. She exhibits no edema and no tenderness.  Lymphadenopathy:    She has no cervical adenopathy.  Neurological: She is alert and oriented to person, place, and time. No cranial nerve deficit. She exhibits normal muscle tone. Coordination normal.  Skin: Skin is warm and dry. No rash noted. She is not diaphoretic. No erythema. No pallor.     Psychiatric: She has a normal mood and affect. Her speech is normal and behavior is normal. Judgment and thought content normal. Cognition and memory are normal.          Assessment & Plan:   Problem List Items Addressed This Visit     Unprioritized   Alcohol abuse     Persistent ongoing alcohol use. Now living at son's home. Discussed risks of repeated falls with ongoing alcohol use.  Relevant Orders      Hemoglobin A1c   Chronic kidney disease, stage 3     Repeat Renal function with labs today.    Relevant Orders      Comprehensive metabolic panel      Hemoglobin A1c   Leg wound, right - Primary     Right posterior leg wound. Will set up evaluation at [facility].  Continue dry dressing for now. She repeatedly requests medication for pain, however it appears she received Dilaud[facility] from the [facility]. She has history of narcotic and alcohol abuse. No narcotic prescriptions from our office. Recommended she limit use of Advil. Use Tylenol 1000mg  po bid prn pain.    Relevant Orders      Ambulatory referral to Wound Clinic      CBC      Hemoglobin A1c   Recurrent falls     Recurrent falls related to alcohol abuse and gait instability. Would like to stop Xarelto, however not clear why this medication was started. Trying to get in contact with [facility] physician to determine why Xarelto was started.    Relevant Orders      Hemoglobin A1c       Return in about 2 weeks (around 12/18/2013) for Recheck.

## 2013-12-05 ENCOUNTER — Telehealth: Payer: Self-pay | Admitting: Internal Medicine

## 2013-12-05 ENCOUNTER — Encounter: Payer: Self-pay | Admitting: Internal Medicine

## 2013-12-05 DIAGNOSIS — I82409 Acute embolism and thrombosis of unspecified deep veins of unspecified lower extremity: Secondary | ICD-10-CM | POA: Insufficient documentation

## 2013-12-05 NOTE — Telephone Encounter (Signed)
emmi emailed °

## 2013-12-08 LAB — WOUND AEROBIC CULTURE

## 2013-12-20 ENCOUNTER — Ambulatory Visit: Payer: Self-pay | Admitting: Internal Medicine

## 2014-01-04 ENCOUNTER — Ambulatory Visit: Payer: Self-pay | Admitting: Orthopedic Surgery

## 2014-01-28 ENCOUNTER — Telehealth: Payer: Self-pay

## 2014-01-28 NOTE — Telephone Encounter (Signed)
Teresa Copeland, How to we change her status in the system?

## 2014-01-28 NOTE — Telephone Encounter (Signed)
I changed the status to deceased.

## 2014-01-28 NOTE — Telephone Encounter (Signed)
[facility] called and wanted to report that the patient has passed away.  She was found on the 13th by a friend at home, already passed away in her home.  She was then brought to the hospital.

## 2014-01-29 ENCOUNTER — Encounter: Payer: Self-pay | Admitting: Internal Medicine

## 2014-02-15 DEATH — deceased

## 2014-06-04 NOTE — Discharge Summary (Signed)
PATIENT NAME:  Teresa Copeland, Teresa Copeland MR#:  [id number] DATE OF BIRTH:  1955-09-29  DATE OF ADMISSION:  12/07/2011 DATE OF DISCHARGE:  12/10/2011  DISCHARGE DIAGNOSES: 1. Left second toe methicillin-resistant Staphylococcus aureus infection status post debridement. 2. Acute renal failure, resolved. 3. Anxiety. 4. Chronic obstructive pulmonary disease. 5. History of splenic fracture after traumatic fall status post splenectomy. 6. Ongoing tobacco abuse. 7. Chronic pain syndrome.   DISPOSITION: The patient is being discharged home.   DISCHARGE FOLLOWUP: Follow up with Dr. Ether GriffinsFowler and Dr. Leavy CellaBlocker within 1 to 2 weeks of discharge.   DIET: Regular.   ACTIVITY: As tolerated.   WOUND CARE: The patient has been advised to keep her dressing dry and clean.   DISCHARGE MEDICATIONS:  1. Advair 500/50 one puff twice a day. 2. Vitamin C 1000 mg daily.  3. Valtrex 500 mg daily.  4. Albuterol 90 mcg 2 puffs four times daily. 5. Xanax 5 mg three times daily.  6. Prilosec daily.  7. Flonase one spray to each nostril as needed. 8. Vicodin 5/500 mg one tablet every 4 hours p.r.n. pain. 9. Doxycycline 100 mg twice a day for 14 days.   CONSULTANTS: 1. Gwyneth RevelsJustin Fowler, MD - Podiatry. 2. Orson AloeMichael Blocker, MD - Infectious Disease.   RESULTS: X-ray of the left foot: Lucency involving the base of the proximal phalanx of the left second toe. Correlate with the patient's symptoms and clinical history.   Microbiology: Blood cultures - no growth in 18 to 24 hours.   White count normal, hemoglobin 11.5, and platelet count 335. Glucose 93, BUN 18, creatinine 1.76 to 1.11, sodium 138, potassium 3.8, chloride 108, CO2 20, anion gap 10, and calcium 8.7.   PROCEDURES PERFORMED: Left excision bone of PIP, left second toe, with hammertoe repair.   HOSPITAL COURSE: The patient is a 59 year old female with a history of chronic obstructive pulmonary disease, anxiety, and chronic pain who developed left foot redness and left  second toe swelling and pain. She was sent by her PCP for admission because her outpatient cultures grew MRSA and she failed oral antibiotics for cellulitis. She was admitted with left foot cellulitis with second toe infection with MRSA. On initial x-ray, there was some concern for bone involvement. A podiatry consultation with Dr. Ether GriffinsFowler was obtained who did debridement and found that there was some involvement of the tendon and joint capsule, but none of the bone. The patient was empirically treated with vancomycin while in the hospital and is being discharged home on oral doxycycline. She had mild renal failure which has resolved. The rest of her medical problems remained stable. She will followup with Dr. Ether GriffinsFowler and Dr. Leavy CellaBlocker within 1 to 2 weeks after discharge. She has been advised to keep her dressing clean and dry until she is seen by Dr. Ether GriffinsFowler in his office.   TIME SPENT: 45 minutes.  ____________________________ Darrick MeigsSangeeta Sarai January, MD sp:slb D: 12/10/2011 13:05:19 ET     T: 12/10/2011 13:24:11 ET        JOB#: [id number] cc: Darrick MeigsSangeeta Melane Windholz, MD, <Dictator> Justin A. Ether GriffinsFowler, DPM Darrick MeigsSANGEETA Lezette Kitts MD ELECTRONICALLY SIGNED 12/10/2011 13:41

## 2014-06-04 NOTE — Consult Note (Signed)
Details:    - full note dictated. Pt with exposed bone pipj left 2nd toe with mild surrounding erythema. D/W pt need to remove bone and straighten toe to heal open wound. Pt agrees. All r/b/a/c associated discussed and consent given. Plan for tomorrow.   Electronic Signatures: Gwyneth RevelsFowler, Kaydyn Chism (MD)  (Signed 23-Oct-13 16:55)  Authored: Details   Last Updated: 23-Oct-13 16:55 by Gwyneth RevelsFowler, Blossie Raffel (MD)

## 2014-06-04 NOTE — Consult Note (Signed)
PATIENT NAME:  Teresa Copeland, Teresa Copeland MR#:  [id number] DATE OF BIRTH:  11/04/1955  DATE OF CONSULTATION:  12/08/2011  REFERRING PHYSICIAN:   CONSULTING PHYSICIAN:  Lei Dower A. Ether Griffins, DPM  REASON FOR CONSULTATION: Left second toe pain and redness.   HISTORY OF PRESENT ILLNESS: This is a 59 year old female who was admitted with worsening left foot pain, redness and swelling. Was seen in primary care physician office this week and sent over to the hospital. She has had an open wound on her second toe for some time now. She has been seen by [facility]. She has apparently had a culture that grew an MRSA and has been on clindamycin and Levaquin without improvement. With the worsening swelling she was admitted for IV antibiotics and further evaluation and treatment.   PAST MEDICAL HISTORY:  1. Chronic obstructive pulmonary disease with tobacco abuse.  2. Hypertension.  3. Recent splenic rupture, status post splenectomy.   MEDICATIONS:  1. Advair. 2. Albuterol. 3. Xanax. 4. Valtrex. 5. Vitamin C.   ALLERGIES: Keflex, NSAIDs, penicillin, sulfa, and tramadol.   SOCIAL HISTORY: She smokes a pack a day with a 30 to 40 year pack-year history. She lives at home. Works at [employer]. She has to wear steel toe or composite type of shoes which she believes is the cause of the problem.   REVIEW OF SYSTEMS: Other than the foot pain she denies any other major problems. No shortness of breath or chest pains of recent. No fever, chills. Further review of systems as above.   PHYSICAL EXAMINATION:  GENERAL: She is alert and oriented in no apparent distress.   VASCULAR: She has a palpable DP and PT pulse to the left foot.   NEUROLOGIC: Sensation is completely intact to the lower extremity.   DERMATOLOGIC: She has a full-thickness open wound on the dorsal aspect of the PIPJ with exposure of the PIPJ itself. There is surrounding erythema. No purulent drainage. No lymphangitic streaking. No proximal cellulitis noted.  The tendon and joint themselves are all exposed through this wound after I removed a superficial scab to the area.   MUSCULOSKELETAL: She has a severe hammertoe contracture causing the dorsal pressure on her second toe at the PIPJ. All the other lesser toes are also hammertoe in nature. She also has hallux valgus deformity on her left foot.   LABORATORY, DIAGNOSTIC AND RADIOLOGICAL DATA: I reviewed x-rays. I reviewed these, did not show any signs of any severe obvious erosive changes. No gas in the soft tissue It does show the severe hammertoe contractures with the hallux valgus deformity.   ASSESSMENT:  1. Recent wound left second PIPJ with history of MRSA infection on culture. 2. Exposure of bone through a full thickness open ulcer to this left second PIPJ.   PLAN: I discussed the likelihood that there is superficial contamination of the joint and it will be extremely difficult to get this wound to heal up without removal of bone and realignment of the toe somewhat. Certainly this seems to be stable enough at this point that if the patient chose she could have this performed outpatient though with recent infection and improvement on IV antibiotics I would recommend we go ahead and perform an arthroplasty of the second toe to remove the prominence and speed up and assist with healing of this wound. She has agreed to this and will plan on performing this tomorrow. I discussed the risks, benefits, alternatives, and complications associated with the procedure and consent has been given.  She is currently being seen by infectious disease. I will re-evaluate her in the Operating Room tomorrow. With her exposure but no obvious bony destruction will have to discuss IV versus p.o. antibiotics with infectious disease.  ____________________________ Argentina DonovanJustin A. Ether GriffinsFowler, DPM jaf:cms D: 12/08/2011 17:00:51 ET T: 12/08/2011 17:41:32 ET  JOB#: [id number] cc: Jill AlexandersJustin A. Ether GriffinsFowler, DPM, <Dictator> Satish Hammers  DPM ELECTRONICALLY SIGNED 12/10/2011 14:50

## 2014-06-04 NOTE — Op Note (Signed)
PATIENT NAME:  Teresa Copeland, Teresa Copeland MR#:  [id number] DATE OF BIRTH:  01-May-1955  DATE OF PROCEDURE:  12/09/2011  PREOPERATIVE DIAGNOSIS: Left second toe ulceration with hammertoe contracture.   POSTOPERATIVE DIAGNOSIS: Left second toe ulceration with hammertoe contracture.   PROCEDURE: Excision of proximal interphalangeal joint left second toe with hammertoe repair and MTPJ capsulotomy.   SURGEON: Merl Bommarito A. Ether GriffinsFowler, DPM  ANESTHESIA: MAC with local.   HEMOSTASIS: Ankle tourniquet inflated to 225 mmHg for approximately 20 minutes.   COMPLICATIONS: None.   SPECIMEN: Soft tissue for culture and bone for pathology.   OPERATIVE INDICATIONS: This is a 59 year old female seen here in the hospital with an open left second toe ulceration that she has been dealing with for some time. We have discussed nonsurgical versus surgical intervention and she presents today for surgery. All risks, benefits, alternatives, and complications associated with surgery were discussed with the patient in full and consent has been given.   DESCRIPTION OF PROCEDURE: The patient was brought into the Operating Room and placed on the operating table in the supine position. IV sedation was then administered by the anesthesia team. Local sedation was administered as stated above. The left lower extremity was prepped and draped in the usual sterile fashion. Attention was directed to the second toe where after inflation of the tourniquet a longitudinal incision was made from the MTPJ distal to the PIPJ. At this time, the superficial capsule and extensor tendon was noted to be exposed but there was not any obvious exposure of the bone. I excised the obvious exposed joint capsule and tendon and will send this off for culture evaluation. Exposure of the proximal interphalangeal joint was then undertaken. The head of the metatarsal and base of the proximal phalanx were noted. These looked to be intact grossly without any obvious infectious  process. I then removed the head of the proximal phalanx and base of the middle phalanx so as to realign the second toe joint and remove the prominence causing the ulceration. There was still noted to be some residual hammertoe contracture. I then reflected the extensor tendon proximal, exposed the MTPJ and performed a dorsal and medial and lateral capsulotomy of the joint. Much better alignment of the second toe was noted after this was performed. All wounds were then flushed with copious amounts of irrigation. Layered closure was performed with 4-0 Vicryl for the extensor tendon and subcutaneous tissue and a 4-0 nylon for the skin. A well compressive sterile dressing was placed around the left forefoot. She tolerated the procedure and anesthesia well and was transported from the Operating Room to the PACU with all vital signs stable and neurovascular status intact. I will see her in the outpatient clinic in 5 to 7 days. She will be readmitted to the floor.  ____________________________ Argentina DonovanJustin A. Ether GriffinsFowler, DPM jaf:slb D: 12/09/2011 13:31:01 ET T: 12/09/2011 13:58:16 ET JOB#: [id number]  cc: Jill AlexandersJustin A. Ether GriffinsFowler, DPM, <Dictator> Ashlea Dusing DPM ELECTRONICALLY SIGNED 12/10/2011 14:49

## 2014-06-04 NOTE — Consult Note (Signed)
PATIENT NAME:  Teresa Copeland, Teresa Copeland MR#:  [id number] DATE OF BIRTH:  29-Aug-1955  DATE OF CONSULTATION:  12/08/2011  REFERRING PHYSICIAN:  Altamese Dilling, MD CONSULTING PHYSICIAN:  Rosalyn Gess. Chauncey Sciulli, MD  REASON FOR CONSULTATION: Left second toe infection.   HISTORY OF PRESENT ILLNESS: The patient is a 59 year old white female with a past history significant for chronic renal insufficiency, chronic obstructive pulmonary disease, and left second toe hammertoe who was admitted yesterday with swelling, redness and pain in the left second toe. The patient states that she has had blisters on that toe in the past due to wearing steel-toed boots at work with her hammertoe. This has healed in the past, however, over the last week to 10 days she has had increased swelling and redness around the second toe that has extended into the forefoot. She has not had any fevers, chills, or sweats. She has had no nausea or vomiting. She has had some purulent drainage from the area. She was seen in urgent care and was given azithromycin. A culture was obtained at that time which grew MRSA. She was then switched to Levaquin and clindamycin. She did not clinically improve and the pain worsened. On admission she was started on vancomycin.   ALLERGIES: Keflex, NSAIDs, penicillin, sulfa drugs, and tramadol.   PAST MEDICAL HISTORY:  1. Chronic renal insufficiency, she is followed by nephrology.  2. Chronic obstructive pulmonary disease.  3. Hypertension.  4. Traumatic splenic rupture status post splenectomy in June 2013.  5. Hypertension.  6. Hammertoe in the second toe on the left foot.   FAMILY HISTORY: Positive for hypertension, diabetes, breast cancer, and congestive heart failure.   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 She lives by herself. She smokes a pack of cigarettes per day. Occasional alcohol use. She denies any injecting drug use. No pets at home.   REVIEW OF  SYSTEMS: GENERAL: No fevers, chills, sweats, or sweats. Some malaise. HEENT: No headaches or sinus congestion. No sore throat. NECK: No stiffness. No swollen glands. RESPIRATORY: Some cough. No sputum production. No wheezing. CARDIAC: No chest pain or palpitations. GI: No nausea, no vomiting, no abdominal pain, and no change in bowels. GENITOURINARY: No change in her urine. MUSCULOSKELETAL: She has pain in the left second toe. She has an ulceration there as well as redness extending up the dorsum of the foot. The area is tender. She denies any other joint complaints. SKIN: No rashes other than that indicated on the extremity examination. NEUROLOGIC: No focal weakness. No numbness or tingling in her feet. ENDOCRINE: She has been told that her blood sugars have been elevated the last few visits but she is not on any medicines for diabetes. PSYCHIATRIC: No complaints. All other systems are negative.   PHYSICAL EXAMINATION:   VITAL SIGNS: T-max 98.2, T-current 98.2, pulse 88, blood pressure 109/69, and saturation 99% on room air.   GENERAL: A 59 year old white female in no acute distress.   HEENT: Normocephalic, atraumatic.   LUNGS: Clear to auscultation bilaterally with good air movement. No focal consolidation.   HEART: Regular rate and rhythm without murmur, rub, or gallop.   ABDOMEN: Soft, nontender, and nondistended. No hepatosplenomegaly. No hernia is noted.   EXTREMITIES: She has swelling and redness of the left second toe. There is an ulcerative area over the PIP of that toe. There is no purulent discharge. The entire toe is swollen and erythematous. There is minimal erythema extending into the forefoot and  is not well demarcated. The foot and leg were not edematous. There was no evidence for lymphangitic streaking. None of the other toes appeared to be inflamed. The right side was within normal limits.   SKIN: There were no rashes other than that noted above. There was no stigmata of  endocarditis, specifically no Janeway lesions or Osler nodes.   NEUROLOGIC: The patient is awake and interactive, moving all four extremities.   PSYCHIATRIC: Mood and affect appeared normal.   LABS/RADIOLOGIC STUDIES: BUN is 18, creatinine 1.76, bicarbonate 20, and anion gap 10. White count is 10.1, hemoglobin 11.5, platelet count 335, and ANC 6.8.   A plain x-ray of the left foot showed lucency involving the base of the proximal phalanx of the left second toe. There is a hallux valgus deformity of the first metatarsophalangeal joint as well.   IMPRESSION: A 59 year old white female with a history of chronic renal insufficiency, chronic obstructive pulmonary disease, left second hammertoe, and splenic rupture status post splenectomy admitted with left second toe MRSA infection.   RECOMMENDATIONS:  1. Given the lack of soft tissue in this area, joint and bone involvement is moderately likely. She is not a diabetic but states that her blood sugar has been elevated recently. She has had problems with wearing steel-toed boots and developing blisters on that toe in the past.  2. I agree with vancomycin for now.  3. Podiatry is to see her. At this point, she has not received good anti-MRSA therapy. If it is felt that she does not have bone or joint involvement, then doxycycline would be an appropriate choice as an outpatient. If there is bone or joint involvement, then she may need surgical debridement or toe amputation with IV antibiotics.  4. A MRI may be beneficial to determine if ostia is present, but podiatry may feel that the likelihood is still high given the clinical presentation. We will await their input.  5. Would continue contact isolation for now.   This is a low level infectious disease consult. Thank you very much for involving me in [name]'s care.  ____________________________ Rosalyn GessMichael E. Kerrion Kemppainen, MD meb:slb D: 12/08/2011 14:47:15 ET     T: 12/08/2011 15:11:10 ET         JOB#: [id number] cc: Rosalyn GessMichael E. Sandon Yoho, MD, <Dictator> Viet Kemmerer E Kebron Pulse MD ELECTRONICALLY SIGNED 12/13/2011 12:06

## 2014-06-04 NOTE — Consult Note (Signed)
Impression: 59yo WF w/ h/o CRI, COPD, left 2nd hammertoe, splenic rupture, s/p splenectomy admitted with left 2nd toe Methacillin Resistant Staph aureus infection.  Given the lack of soft tissue in this area, joint and bone involvement is moderately likely.  She is not diabetic, but states that her BS has been elevated recently.  She has had problems with wearing steel toed boots and developing blisters on that toe. Agree with vanco for now. Podiatry to see her.  At this point, she has not received good anti-MRSA therapy.  If it is felt that she does not have bone or joint involvement, then doxcycline would be an appropriate choice as an outpt.  If there is bone or joint involvement, then she may need surgical debridement or toe amputation with IV antibiotics. MRI may be beneficial to determine if osteo is present, but podiatry may feel that the likelihood is so high given the clinical presentation.  Will await their input. Continue contact isolation.  Electronic Signatures: Rudean Icenhour, Rosalyn GessMichael E (MD)  (Signed on 23-Oct-13 14:38)  Authored  Last Updated: 23-Oct-13 14:38 by Domingo Fuson, Rosalyn GessMichael E (MD)

## 2014-06-04 NOTE — H&P (Signed)
PATIENT NAME:  Teresa Copeland, RIVERON MR#:  [id number] DATE OF BIRTH:  07-05-1955  DATE OF ADMISSION:  12/07/2011  PRIMARY CARE PHYSICIAN: Dr. Ronna Polio.   CHIEF COMPLAINT: Left foot pain and redness.   HISTORY OF PRESENT ILLNESS: This is a 59 year old female who presents to the Emergency Room from her primary care physician's office due to worsening left foot redness and swelling. The patient apparently has been followed by [facility] and seen Dr. Al Corpus a few weeks ago and had the left toe drained and debrided. Apparently, the cultures grew out Methicillin Resistant Staphylococcus Aureus. She was put on antibiotics, Levaquin and clindamycin, although her symptoms were not improving. She went to see Dr. Dan Humphreys and complained of worsening pain and swelling and she sent her over to the ER for further evaluation. The patient's foot does have a small ulcer on her left second toe which is nondraining, excoriated with some residual redness and erythema around it. The patient says that her pain has gotten a bit worse. Therefore, she is being admitted for IV antibiotics. She remains afebrile. She does not have any nausea, vomiting or any other associated symptoms presently.   REVIEW OF SYSTEMS: CONSTITUTIONAL: No documented fever. No weight gain or weight loss. EYES: No blurred or double vision. ENT: No tinnitus. No postnasal drip. No redness of the oropharynx. RESPIRATORY: No cough, no wheeze, no hemoptysis. CARDIOVASCULAR: No chest pain, no orthopnea, no palpitations or syncope. GASTROINTESTINAL: No nausea, no vomiting, no diarrhea, no abdominal pain, no melena, no hematochezia. GU: No dysuria, no hematuria. ENDOCRINE: No polyuria or nocturia. No heat or cold intolerance. HEME: No anemia. No bruising. No bleeding. INTEGUMENTARY: No rashes or lesions. MUSCULOSKELETAL: No arthritis, no swelling, and no gout. NEUROLOGIC: No numbness, no tingling, no ataxia, no seizure-type activity. PSYCH: Positive anxiety. No  insomnia, no ADD.   PAST MEDICAL HISTORY:  1. Chronic obstructive pulmonary disease with ongoing tobacco abuse.  2. Hypertension.  3. 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   ALLERGIES: Keflex, NSAIDs, penicillin, sulfa drugs and tramadol.   SOCIAL HISTORY: Does smoke about a pack per day. Does have a 30-40 pack-year smoking history. Occasional alcohol use. No illicit drug abuse. Lives at home by herself.   FAMILY HISTORY: Both mother and father had hypertension. Father was also diabetic. Mother died from breast cancer.   CURRENT MEDICATIONS:  1. Advair 500/50, one puff b.i.d.  2. Albuterol inhaler as needed.  3. Xanax 1 mg t.i.d.  4. Valtrex 5 mg daily.  5. Vitamin C 1000 mg daily.   PHYSICAL EXAMINATION ON ADMISSION:  VITAL SIGNS: Temperature 98.4, pulse 100, respirations 20, blood pressure 116/64, sats 98% on room air.   GENERAL: She is a pleasant appearing female in no apparent distress.   HEENT: Atraumatic, normocephalic. Extraocular muscles are intact. Pupils equal and reactive to light. Sclerae anicteric. No conjunctival injection. No pharyngeal erythema.   NECK: Supple. No jugular venous distention. No bruits, no lymphadenopathy or thyromegaly.   HEART: Regular rate and rhythm. No murmurs, no rubs, and no clicks.   LUNGS: Clear to auscultation bilaterally. No rales, no rhonchi, no wheezes.   ABDOMEN: Soft, flat, nontender, nondistended. Has good bowel sounds. No hepatosplenomegaly appreciated.   EXTREMITIES: No evidence of any cyanosis, clubbing, or peripheral edema. Has +2 pedal and radial pulses bilaterally.   NEUROLOGIC: The patient is alert, awake, and oriented x3 with no focal motor or sensory deficits appreciated.   SKIN: Moist and warm.  On the left second toe the patient has an ulcer about 2 x 2 centimeters, which is dry, excoriated with some residual redness and erythema around it. There is no red streaking going up her  left foot.   LYMPHATIC: There is no cervical or axillary lymphadenopathy.   LABORATORY, DIAGNOSTIC, AND RADIOLOGICAL DATA: Serum glucose 93, BUN 18, creatinine 1.7, sodium 138, potassium 3.8, chloride 109, bicarbonate 20. Patient's white cell count 10.1, hemoglobin 11.5, hematocrit 33.1, platelet count 335.   The patient did have an x-ray of the left foot which showed lucency involving the base of the proximal phalanx of the left second toe. There is a hallux valgus deformity of the first metatarsophalangeal joint.   ASSESSMENT AND PLAN: This 59 year old female with a history of alcohol abuse, history of splenic rupture after a traumatic fall status post splenectomy, chronic obstructive pulmonary disease with ongoing tobacco abuse, anxiety and chronic pain who presents to the hospital due to left foot redness and swelling of the second toe and having failed p.o. antibiotic treatment for cellulitis.  1. Cellulitis of the left foot, specifically the second toe. The patient has failed outpatient p.o. antibiotic therapy with Levaquin and clindamycin as she has worsening redness and swelling. I will admit her and start her on IV vancomycin. We will get a podiatry consult. Again, the patient has been seen by the [facility] by Dr. Al CorpusHyatt who currently is on vacation and had the infection drained a few weeks back. Culture apparently grew out Methicillin Resistant Staphylococcus Aureus as per her primary care physician and I will try to obtain those results. Will control her pain cautiously as she has high pain-seeking behavior as per her primary care physician.  2. Chronic obstructive pulmonary disease. There is no acute exacerbation. We will continue with Advair and p.r.n. nebulizer treatments. 3. Anxiety. Continue Xanax.  4. Acute renal failure. This is likely acute on chronic renal failure. Her baseline creatinine in July was 0.8. As per discussion with Dr. Ronna PolioJennifer Walker, her primary care physician,  her creatinine was a bit worse about a week or so ago. Therefore, it is improved at 1.7. I will follow her BUN and creatinine, renal dose her meds and avoid nephrotoxins for now.   CODE STATUS: The patient is a FULL CODE.     TIME SPENT WITH ADMISSION: 55 minutes.   ____________________________ Rolly PancakeVivek J. Cherlynn KaiserSainani, MD vjs:ap D: 12/07/2011 13:36:25 ET T: 12/07/2011 14:00:14 ET JOB#: [id number]  cc: Rolly PancakeVivek J. Cherlynn KaiserSainani, MD, <Dictator> Ginette PitmanJennifer A. Dan HumphreysWalker, MD Houston SirenVIVEK J Shelanda Duvall MD ELECTRONICALLY SIGNED 12/08/2011 12:26

## 2014-06-07 NOTE — Consult Note (Signed)
Brief Consult Note: Diagnosis: chest pain.   Patient was seen by consultant.   Consult note dictated.   Orders entered.   Comments: Ordered tests, will continue following further.  Electronic Signatures: Altamese DillingVachhani, Lavonn Maxcy (MD)  (Signed 20-Sep-14 18:40)  Authored: Brief Consult Note   Last Updated: 20-Sep-14 18:40 by Altamese DillingVachhani, Trellis Vanoverbeke (MD)

## 2014-06-07 NOTE — Consult Note (Signed)
PATIENT NAME:  Teresa Copeland, Teresa Copeland MR#:  [id number] DATE OF BIRTH:  05/04/1955  DATE OF CONSULTATION:  11/04/2012  REFERRING PHYSICIAN:  Audery AmelJohn T. Clapacs, MD CONSULTING PHYSICIAN:  Hope PigeonVaibhavkumar G. Elisabeth PigeonVachhani, MD  DATE OF ADMISSION: 10/31/2012 to behavioral medicine.  REASON FOR CONSULTATION: Chest pain versus anginal pain.   HISTORY OF PRESENT ILLNESS: This is a 59 year old female who was admitted to psychiatry for major depressive disorder. Medical consult is called in because she has complaint of chest pain which is on the left side, on and off, ranging from 5 to 7, sharp, getting worse with cough since the last 5 to 6 days. She had a history of chest pain and cardiac catheterization was done twice in the past approximately 15 to 20 years ago without any significant blockages or need for stent as per the patient, but she is not very sure about the results. [place] was done in [place]. On further questioning, the patient denies any complaint of fever, but she has some cough which is nonproductive. The pain is getting worse with cough and deep breath, but not with any movement. No injury to the chest.   REVIEW OF SYSTEMS:    CONSTITUTIONAL: Negative for fever, fatigue, pain or weight loss.  EYES: No blurring, double vision or discharge.  EARS: No tinnitus, ringing in the ears.  RESPIRATORY: There is cough which is dry. No wheezing or shortness of breath.  CARDIOVASCULAR: There is some chest pain which is on the left lower side on and off. No palpitations, leg swelling or dizziness.  ABDOMEN: Denies any nausea, vomiting, diarrhea or abdominal pain.  GENITOURINARY: Denies any dysuria, hematuria or increased frequency of the urination.  ENDOCRINE: Denies any heat or cold intolerance or excessive sweating.  SKIN: Denies any rashes or lesions on the skin.  MUSCULOSKELETAL: Denies any swelling or pain in the joints.  NEUROLOGICAL: Denies any tingling, numbness or weakness in any limbs.  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   PAST SURGICAL HISTORY: None.   SOCIAL HISTORY: Lives alone. 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 She is a current smoker. She smokes almost 2 packs every day. Denies doing any drugs but drinks alcohol infrequently.   FAMILY HISTORY: Strongly positive for stroke in multiple family members.   HOME MEDICATIONS: Metoprolol XL b.i.d. and uses inhalers for her COPD. Does not remember any names.  PHYSICAL EXAMINATION:  VITAL SIGNS: Temperature 98.7, pulse 71, respirations 18, blood pressure 109/66, pulse oximetry 95% on room air.  GENERAL: The patient is fully alert and oriented to time, place and person and does not appear in any acute distress.  HEENT: Head and neck atraumatic. Conjunctivae pink. Oral mucosa moist.  NECK: Supple. No JVD.  RESPIRATORY: Bilateral clear and equal air entry.  CARDIOVASCULAR: S1, S2 present, regular. No murmur.  ABDOMEN: Soft, nontender. Bowel sounds present. No organomegaly.  SKIN: No rashes.  EXTREMITIES: Legs: No edema.  NEUROLOGICAL: Power 5/5. Follows commands. No gross abnormality. No tremors.  PSYCHIATRIC: Currently under treatment for depression by psychiatry but to me, she appears to be oriented and cooperative   LABORATORY AND RADIOLOGICAL DATA: As it was done on 15th  of September, creatinine was 0.9. BUN was 4. Potassium was 2.9, which was corrected and checked later on 18th of September 4.6. Magnesium was 1.2. Troponin is less than 0.02. White cell count on presentation was 12,600 and hemoglobin was 12.9. Urinalysis was grossly negative. Vitamin B12 level was 253. RPR was nonreactive.   CT of  the head: No evidence of acute ischemic or hemorrhagic or intracranial mass. CT cervical spine: No evidence of acute cervical spine fracture.   ASSESSMENT AND PLAN: A 59 year old female with past medical history of anginal pain in remote past, chronic smoker and alcoholic, and has strong family history of stroke. Admitted to psych for treatment of depressive disorder and has complaint of recurrent intermittent chest pain.  1.  Chest pain: Will follow her troponins and will do EKG. Will also get echocardiogram for further management and will do chest x-ray to see for any organic cause in the chest. Most likely, the pain is muscular the way she describes each time getting worse with cough and deep breath, but will make sure there is no cardiac reason for the pain at this time. The patient is also not sure about her hyperlipidemia and diabetes status, but she says her sugar level was high in the past, so we will check her glucose checks without any coverage every 6 hours and if it is high, we will start her on treatment.  2.  Hypertension: Currently blood pressure is very well under control by metoprolol XL 25 daily. Will continue the same.  3.  Chronic smoker: Smoking cessation counseling done for 5 minutes and offered her nicotine patch. She agreed for nicotine patch. Will provide her with that. 4.  Chronic obstructive pulmonary disease: The patient was using inhalation. Currently there is no wheezing. She is already receiving albuterol and fluticasone inhaler. Will continue the same.  5.  Hypomagnesemia: Magnesium replacement and check tomorrow morning.  6.  Psychiatric disorders: Treatment as per psychiatry team.   TOTAL TIME SPENT IN THE CONSULT: 50 minutes.   Thank you for the consult. Will continue monitoring along with you for further management of  issues.   ____________________________ Hope Pigeon Elisabeth Pigeon, MD vgv:jm D: 11/04/2012 18:55:44 ET T: 11/04/2012 20:33:07  ET JOB#: [id number]  cc: Hope Pigeon. Elisabeth Pigeon, MD, <Dictator> Altamese Dilling MD ELECTRONICALLY SIGNED 11/10/2012 18:34

## 2014-06-07 NOTE — Consult Note (Signed)
PATIENT NAME:  Teresa Copeland, Teresa L MR#:  [id number] DATE OF BIRTH:  03-04-1955  DATE OF CONSULTATION:  10/31/2012  REFERRING PHYSICIAN:  Sharman CheekPhillip Stafford, MD CONSULTING PHYSICIAN:  Ardeen FillersUzma S. Garnetta BuddyFaheem, MD  REASON FOR CONSULTATION: "I am going to stop."  HISTORY OF PRESENT ILLNESS: The patient is a 59 year old female who appeared older than her stated age, presented to the ED accompanied by her son as she was complaining of shortness of breath and dizziness. According to her son, she was hysterical and was having trouble breathing and feeling very weak. Her son reported that his mother was having problems with her drinking and she drinks every day. She works at [employer] and had an injury while at work. He also mentioned that the patient had received compensation of approximately 86,000 dollars and she has spent 36,000 in 4 months. He found out from the neighbors that she has been stealing  and she gave  bankcard to go to the store and she was living with the people.   During my interview with the patient, she appears somewhat confused and was talking about using drugs and reported that she was [place] born [place] and then she moved back to [place] with [place] husband. She stated that then she recently relocated back to [place] after she was separated from her husband. However, she reported that she does not remember when she came back to [place]. She reported that she had a bag full of pills that she has been using, although they were old pills in them. She was unable to tell me what type of pills she was taking. According to the initial assessment, she was taking a combination of methadone, methocarbamol, Percocet, Vicodin and loperamide. They were found in the patient's purse, in a bottle, and were identified by the pharmacy. The patient was also drinking steadily on top of taking these pills. Her drug screen was positive for benzodiazepines. The patient reported that she has been following  multiple times at home and she feels that her memory is also getting bad. The patient reported that she thinks that she has been doing poorly over the past few days. She stated that she was brought to the hospital by her son and she needs some help. She however currently denied having any thoughts to harm herself.   PAST PSYCHIATRIC HISTORY: The patient stated that she has not seen a psychiatrist in many years. She currently denied having any thoughts to hurt herself. However, she was minimizing her use of alcohol at this time. Her urine drug screen was positive and her blood alcohol level was more than 246.   PAST MEDICAL HISTORY: The patient has history of multiple falls recently. She also has history of asthma as well as high blood pressure.   CURRENT HOME MEDICATIONS:  1.  Percocet 5/325 mg 1 tablet every 6 hours. 2.  Advair Diskus 500 mcg 1 puff 2 times per day. 3.  Albuterol inhaler 2 puffs 4 times a day as needed for shortness of breath. 4.  Toprol XL 50 mcg extended release once a day. 5.  Elavil as needed.   ALLERGIES: KEFLEX, NSAIDS, PENICILLIN, SULFA DRUGS, TETRACYCLINE, TRAMADOL.   SOCIAL HISTORY: The patient reported that she is currently separated. She has 3 children, a daughter and 2 sons. She was unable to tell me their ages.   REVIEW OF SYSTEMS: CONSTITUTIONAL: The patient denies any fever or chills. No weight changes.  EYES: No double or blurred vision.  RESPIRATORY: Having shortness of breath and cough.  CARDIOVASCULAR: She denies having any chest pain or orthopnea.  GASTROINTESTINAL: She denies having any abdominal pain, nausea, vomiting or diarrhea.  GENITOURINARY: No incontinence or frequency.  ENDOCRINE: No heat or cold intolerance.  LYMPHATIC: No anemia or easy bruising.  INTEGUMENTARY: No acne or rash.  MUSCULOSKELETAL: No muscle pain, but was having joint pain with recurrent falls.  VITAL SIGNS: Temperature 98.5, pulse 96, respirations 20, blood pressure 133/69.    LABORATORY DATA: Glucose 142, BUN 4, creatinine 0.90, sodium 142, potassium 2.9, chloride 115, bicarbonate 16, anion gap 11, osmolality 282, calcium 8.5. Blood alcohol level 243. Protein 6.7, albumin 3.2, bilirubin 0.4, alkaline phosphatase 194, AST 37, ALT 23. CK 64, CK-MB 1.8. UDS positive for benzodiazepines. WBC 12.6, RBC 3.58, hemoglobin 12.9, hematocrit 37.7, MCV 105.   MENTAL STATUS EXAMINATION: The patient is an older-looking female who appears older than her stated age. She was calm and cooperative. She was sitting in her bed. She maintained fair eye contact. His speech was low in tone and volume. Thought process was tangential. Thought content was nondelusional. Her memory appeared intact and she was unable to tell me the ages of her sons, although she told me the years they were born. Her immediate memory appeared poor as well. She does not appear to be hallucinating at this time. She was unable to contract for safety.   DIAGNOSTIC IMPRESSION: AXIS I: 1.  Mood disorder, not otherwise specified.  2.  Alcohol dependence.  3.  Opioid dependence.  AXIS II: None.  AXIS III: Please review the medical history.   TREATMENT PLAN: 1.  The patient is currently under involuntary commitment, and she will be admitted to the behavioral health unit once a bed becomes available.  2.  She will be started on Librium 25 mg p.o. q. 6 hours for alcohol withdrawal and benzodiazepine withdrawal at this time.  3.  She will be monitored closely by the staff and will be given medications to control her symptoms.   Thank you for allowing me to participate in the care of this patient.  ____________________________ Ardeen Fillers. Garnetta Buddy, MD usf:sb D: 10/31/2012 13:29:27 ET T: 10/31/2012 13:44:39 ET JOB#: [id number]  cc: Ardeen Fillers. Garnetta Buddy, MD, <Dictator> Rhunette Croft MD ELECTRONICALLY SIGNED 11/07/2012 9:09

## 2014-06-08 NOTE — Consult Note (Signed)
PATIENT NAME:  Teresa Copeland, Teresa Copeland MR#:  [id number] DATE OF BIRTH:  18-Jul-1955  DATE OF CONSULTATION:  06/29/2013  REFERRING PHYSICIAN:  Dr. Mordecai MaesSanchez CONSULTING PHYSICIAN:  Dr. Shelle Ironein and Hardie ShackletonKaryn M. Arlone Lenhardt, PA-C  REASON FOR CONSULTATION: Possible upper gastrointestinal bleed with some concerns of blood-tinged emesis.   HISTORY OF PRESENT ILLNESS: This is a pleasant 59 year old female with a past medical history significant for alcohol use and abuse, who is admitted with a diagnosis of alcohol-induced pancreatitis. Her lipase was 2200 and liver enzymes are slightly elevated with a bilirubin of 1.3, AST 94, ALT 30 and alkaline phosphatase 321. She was concerning of epigastric abdominal pain that was sudden onset after alcohol intake and there was accompanying nausea and vomiting. Each time she has vomited the patient reports noticing specks of bright red blood mixed into her vomit. The last time she threw up was yesterday and she did notice blood at that time. This morning, she was advanced to a regular diet and did tolerate a full meal without having any further emesis. The patient is denying any lightheadedness or dizziness. There is no fever or chills. There is no chest pain or shortness of breath. She does report trouble sleeping, though she is a quite tiresome throughout our discussion and has felt fatigued. Her hemoglobin is 11.9 with an MCV of 113. The patient did also recently undergo an EGD with ERCP during the last admission, April 16, 2013, with normal upper gastrointestinal findings., but ERCP did perform a sphincterotomy with multiple balloon sweeps due to a dilated common bile duct. Imaging during this hospitalization did include an abdominal ultrasound noting a dilated common bile duct of 9.4 mm as well as fatty liver changes. The patient is feeling overall well today and does still report a "sore" abdomen, but states that the pain has significantly improved. No further nausea or vomiting today.   PAST  MEDICAL HISTORY: Depression, anxiety, alcohol use and abuse, hypertension, COPD, tobacco use, history of medical noncompliance.   ALLERGIES: PENICILLIN, NSAIDS, KEFLEX, TRAMADOL AND TETRACYCLINE.   PAST SURGICAL HISTORY: No prior surgeries.   SOCIAL HISTORY: The patient does report daily excessive alcohol intake. She can go through  a gallon of vodka in about 3 days' time. She is a daily tobacco smoker, smoking about 2 packs per day for the past 40 years. No illicit drug use. No NSAID use. She lives with her son.   FAMILY HISTORY: There is no known family history of GI malignancy, colon polyps or IBD. Mother had breast cancer. Father had bladder cancer.   HOME MEDICATIONS: Advair, albuterol and metoprolol.   REVIEW OF SYSTEMS: A 10 system review of systems was obtained on the patient. Pertinent positives are mentioned above and otherwise negative.   OBJECTIVE:  VITAL SIGNS: Blood pressure 117/18, heart rate 73, respirations 18, temperature 98.1, bedside pulse oximetry 95%.  GENERAL: This is a pleasant 59 year old female resting quietly and comfortably in bed in no acute distress. Alert and oriented x 3, though she does seem quite tiresome.  HEAD: Atraumatic, normocephalic.  NECK: Supple. No lymphadenopathy noted.  HEENT: Sclerae anicteric. Mucous membranes moist.  LUNGS: Respirations are even and unlabored. Clear to auscultation bilateral anterior lung fields.  HEART: Regular rate and rhythm. S1, S2 noted.  ABDOMEN: Soft, nontender, nondistended. Normoactive bowel sounds noted in all 4 quadrants. No guarding or rebound. No masses, hernias or organomegaly appreciated.  PSYCHIATRIC: Appropriate mood and affect.  NEUROLOGIC: Cranial nerves II through XII are grossly intact.  EXTREMITIES:  Negative for lower extremity edema, 2+ pulses noted bilateral upper extremities.  RECTAL: Deferred.   LABORATORY DATA: White blood cells 9.8, hemoglobin 11.9 down from 13.8, hematocrit 35.5, platelets 130,  MCV 113. Sodium 137, potassium 3.8, BUN 14, creatinine 1.15, glucose 96. Bilirubin 1.3, alkaline phosphatase 321, AST 94, ALT 30, lipase 556, down from 2200. Troponins were negative.   IMAGING: A chest x-ray was obtained on the patient and was negative for any active cardiopulmonary disease. Abdominal ultrasound was obtained on the patient showing a dilated common bile duct of 9.4 mm. There was also fatty liver changes. Other data reviewed. The patient last had a colonoscopy in May 2013 performed by Dr. Bluford Kaufmann and she is due for a repeat in 2018 due to a family history of colon polyps in her brother. ERCP during a previous hospitalization was performed April 16, 2013 by Dr. Bluford Kaufmann to evaluate a M.R.C.P. revealing a dilated common bile duct with accompanying elevated LFTs. The main bile duct was mildly dilated and she is status post sphincterotomy with multiple balloon sweeps. Normal upper GI tract was noted.   ASSESSMENT:  1.  Acute pancreatitis, likely alcohol-induced.  2.  Hematemesis. Her vomiting is described as being blood-tinged. This has actually resolved and she has not vomited at all yet today.  3.  Alcohol use and abuse.  4.  Epigastric abdominal pain, likely secondary to the pancreatitis as mentioned above, this has significantly improved over the past day or so.  5.  Abnormal ultrasound showing a dilated common bile duct. This was recently evaluated through an ERCP in March and she is status post sphincterotomy and multiple balloon sweeps.  6.  History of anxiety and depression.   PLAN: I have discussed this patient's case in detail with Dr. Lutricia Feil, who is involved in the development of the patient's plan of care. In regards to her blood-tinged emesis, we do recommend keeping a close eye on her hemoglobin and she is currently scheduled to have her blood tested every 6 hours, which we agree with. We do also agree with her being on Protonix 40 mg IV twice a day. She was advanced to a regular diet this  morning and was able to tolerate a full meal without any further nausea or vomiting. Her abdominal pain is improving as well. Therefore, because she is no longer vomiting and she is feeling well, we do not feel that proceeding with repeat endoscopic intervention would be clinically warranted at the present moment. She did recently have an ERCP with a normal upper gastrointestinal tract back in March and no clear evidence esophageal varices were noted. We did strongly stress the importance of alcohol cessation and the health benefits associated with this. She verbalized understanding. We will continue to monitor this patient closely throughout hospitalization and make further recommendations pending above and per clinical course. We will keep a close eye on her hemoglobin and monitor her closely for any decline. All questions were answered.   Thank you so much for this consultation and for allowing [place] to participate in the patient's plan of care. ____________________________ Hardie Shackleton. Ruvim Risko, PA-C kme:aw D: 06/29/2013 13:23:33 ET T: 06/29/2013 13:34:05 ET JOB#: [id number]  cc: Hardie Shackleton. Ameerah Huffstetler, PA-C, <Dictator> Hardie Shackleton Arliene Rosenow PA ELECTRONICALLY SIGNED 06/29/2013 14:05

## 2014-06-08 NOTE — Consult Note (Signed)
Pt seen and examined. Please see Wilhelmenia BlaseKaryn Earle's notes. Mild UGI bleed, prob due to M-W tear from retching. Surprisingly, patient has been on reg diet despite having alcohol induced pancreatitis. Continue PPI daily. No plan for EGD unless active bleeding. Will follow. Thanks  Electronic Signatures: Lutricia Feilh, Devinn Voshell (MD)  (Signed on 15-May-15 14:20)  Authored  Last Updated: 15-May-15 14:20 by Lutricia Feilh, Brittanny Levenhagen (MD)

## 2014-06-08 NOTE — Consult Note (Signed)
Chief Complaint:  Subjective/Chief Complaint Less abd pain. Tolerating solids. No further vomiting or hematemesis. Lipase back to normal.   VITAL SIGNS/ANCILLARY NOTES: **Vital Signs.:   16-May-15 05:04  Vital Signs Type Routine  Temperature Temperature (F) 98.5  Celsius 36.9  Pulse Pulse 71  Respirations Respirations 20  Systolic BP Systolic BP 700  Diastolic BP (mmHg) Diastolic BP (mmHg) 76  Mean BP 91  Pulse Ox % Pulse Ox % 96  Pulse Ox Activity Level  At rest  Oxygen Delivery Room Air/ 21 %   Brief Assessment:  GEN no acute distress   Cardiac Regular   Respiratory clear BS   Gastrointestinal mild epig tenderness   Lab Results: Routine Chem:  16-May-15 04:32   Glucose, Serum 87  BUN 8  Creatinine (comp) 1.12  Sodium, Serum 137  Potassium, Serum 3.9  Chloride, Serum  110  CO2, Serum 22  Calcium (Total), Serum  8.0  Anion Gap  5  Osmolality (calc) 272  eGFR (African American) >60  eGFR (Non-African American)  54 (eGFR values <1m/min/1.73 m2 may be an indication of chronic kidney disease (CKD). Calculated eGFR is useful in patients with stable renal function. The eGFR calculation will not be reliable in acutely ill patients when serum creatinine is changing rapidly. It is not useful in  patients on dialysis. The eGFR calculation may not be applicable to patients at the low and high extremes of body sizes, pregnant women, and vegetarians.)  Lipase 335 (Result(s) reported on 30 Jun 2013 at 05:17AM.)  Routine Hem:  16-May-15 04:32   WBC (CBC)  12.0  RBC (CBC)  2.97  Hemoglobin (CBC)  11.2  Hematocrit (CBC)  33.7  Platelet Count (CBC)  116  MCV  113  MCH  37.7  MCHC 33.2  RDW  20.5  Neutrophil % 55.5  Lymphocyte % 28.7  Monocyte % 10.6  Eosinophil % 4.2  Basophil % 1.0  Neutrophil #  6.7  Lymphocyte # 3.5  Monocyte #  1.3  Eosinophil # 0.5  Basophil # 0.1 (Result(s) reported on 30 Jun 2013 at 05:28AM.)   Assessment/Plan:  Assessment/Plan:   Assessment Pancreatitis. Resolved. No further bleeding.   Plan Agree with discharge today. No more alcohol. Pain meds. Nausea meds. Will sign off. Thanks.   Electronic Signatures: OVerdie Shire(MD)  (Signed 16-May-15 11:26)  Authored: Chief Complaint, VITAL SIGNS/ANCILLARY NOTES, Brief Assessment, Lab Results, Assessment/Plan   Last Updated: 16-May-15 11:26 by OVerdie Shire(MD)

## 2014-06-08 NOTE — Consult Note (Signed)
ERCP showed mildly dilated CBD. Biliary sphincterotomy done with balloon sweeps. No stone. Bile draining. Will see if LFT continues to improve or not. Will see if pain improves. Clear liquid diet rest of today. No obvious findings in stomach either. [facility] [place] [facility] [place] Will check back on Wed. Thanks.  Electronic Signatures: Lutricia Feilh, Denson Niccoli (MD)  (Signed on 02-Mar-15 16:00)  Authored  Last Updated: 02-Mar-15 16:00 by Lutricia Feilh, Genavieve Mangiapane (MD)

## 2014-06-08 NOTE — H&P (Signed)
PATIENT NAME:  Teresa Copeland, Teresa Copeland MR#:  [id number] DATE OF BIRTH:  06-06-1955  DATE OF ADMISSION:  04/13/2013  CHIEF COMPLAINT: "Chronic diarrhea."   HISTORY OF PRESENT ILLNESS: This is a patient whose chief complaint is "chronic diarrhea," but it has only been going on for a week. She also has some epigastric pain and has vomited multiple times. Most recent emesis was this morning, and yesterday she had a mild singular hematemesis of small volume. She denies any melena or hematochezia, or dark color to her diarrhea. She denies fevers or chills. Has never had an episode like this before.   Also of note, the patient is a very poor historian. 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 The patient could not remember that at all until prompted. She also does not remember any of her other medical problems, except hypertension and tobacco abuse, but she clearly has multiple other problems as identified and discovered by reviewing her medication list.   The patient denies back pain, hematuria, jaundice or acholic stools, and no fevers or chills.   PAST MEDICAL HISTORY: The patient admits to hypertension and COPD and tobacco abuse. Did not mention anything about psychiatric medication use. In fact, states her only medication is for COPD and hypertension, but clearly on her medication list she takes multiples for depression, anxiety and other psychiatric disorders.   PAST SURGICAL HISTORY: The patient only thinks she has had a hysterectomy and dialysis access. She apparently had the flu in [place] many years ago that resulted in temporary dialysis. She is not on dialysis at this point and has no other kidney problems, but when reviewing her chart, she thought she had possibly a cholecystectomy, but in reviewing her chart, she had a splenectomy for delayed trauma in the recent  past by Dr. Marina Gravel at this hospital. She has also had a hysterectomy and appendectomy.   ALLERGIES: Multiple, see chart.   MEDICATIONS: Multiple, see reconciliation.   FAMILY HISTORY: Noncontributory.   SOCIAL HISTORY: The patient smokes 1-1/2 packs of cigarettes a day, in spite of her medications for COPD. She drinks 2 to 3 drinks per day, and is disabled.   REVIEW OF SYSTEMS:  An attempt at a 10-system review is performed. Negative, with the exception of that mentioned in the HPI, but I believe is somewhat unreliable due to the patient's poor historical ability.   PHYSICAL EXAMINATION: GENERAL: A healthy, comfortable-appearing female patient.  VITAL SIGNS: Stable, she is afebrile.  HEENT: Shows no scleral icterus. No palpable neck nodes.  CHEST: Clear to auscultation.  CARDIAC: Regular rate and rhythm.  ABDOMEN: Distended. Minimally tender in the right upper quadrant. There appears to be multiloculated ventral hernia involving the left of midline, where there is a long midline scar. There is also a periumbilical ventral hernia, which is partially reducible but nontender.  EXTREMITIES: Without edema.  NEUROLOGIC: Grossly intact.  INTEGUMENT: Shows no jaundice.   LABORATORY VALUES: White blood cell count 7.2, H and H of 14 and 42, platelet count 162. Creatinine is 1.2, BUN is 20, potassium of 4.8, sodium 131, alk phos of 266, bilirubin of 2.0, AST, ALT of 140 and 141, albumin 3.3.   An ultrasound shows thickened gallbladder wall and stones.   ASSESSMENT AND PLAN: This is a very poor historian, whose chief complaint is chronic diarrhea, but her diarrhea has  only been going on for about a week. She has had some nausea, vomiting and epigastric pain. She is tender in the right upper quadrant, and although she thought she had her gallbladder removed, the ultrasound confirms that the gallbladder is present, and likely represents acute cholecystitis, which is consistent with her physical exam. 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 My plan would be to admit this patient to the hospital and obtain a CT scan of the abdomen. I do not believe she needs IV contrast. Oral contrast only should be sufficient to delineate the extent of this ventral hernia prior to offering cholecystectomy, either laparoscopic or open. She also has had some hematemesis, but that was minimal, and her H and H is stable, as is her hemodynamics. With that in mind, I would just admit and hydrate and start antibiotics. Will use VTE prophylaxis was well. This was discussed with her. She has very poor insight as to what is going on with her medical condition, however. Although she is lucid and answers questions, they are not necessarily congruent with what is in her chart from a historical standpoint. This will be discussed with Dr. Marina Gravel, who will be taking over care in the morning.   ____________________________ Jerrol Banana. Burt Knack, MD rec:mr D: 04/13/2013 20:58:27 ET T: 04/13/2013 21:44:37 ET JOB#: [id number]  cc: Jerrol Banana. Burt Knack, MD, <Dictator> Florene Glen MD ELECTRONICALLY SIGNED 04/13/2013 22:58

## 2014-06-08 NOTE — Consult Note (Signed)
Pt seen and examined. Full consult to follow. Pt with approx 2 wk hx of epig pain, nausea, vomiting, and diarrhea. Diarrhea improving but UGI sxs persist. Had normal colonoscopy in 5/13 for personal hx of colon polyps.  U/S and CT suggests GB disease but HIDA neg. LFT abnormal. Will order MRCP today to evaluate CBD. NPO after MN. Depending on MRCP results, will schedule either EGD or ERCP tomorrow. Discussed both procedures with patient today. Will follow. Thanks.   Electronic Signatures: Lutricia Feilh, Erdine Hulen (MD) (Signed on 01-Mar-15 10:37)  Authored   Last Updated: 01-Mar-15 10:39 by Lutricia Feilh, Kenley Rettinger (MD)

## 2014-06-08 NOTE — Consult Note (Signed)
Chief Complaint:  Subjective/Chief Complaint Still with abd pain. Had MRCP earlier.   VITAL SIGNS/ANCILLARY NOTES: **Vital Signs.:   02-Mar-15 13:39  Vital Signs Type Routine  Temperature Temperature (F) 98.4  Celsius 36.8  Temperature Source oral  Pulse Pulse 62  Respirations Respirations 17  Systolic BP Systolic BP 834  Diastolic BP (mmHg) Diastolic BP (mmHg) 74  Mean BP 92  Pulse Ox % Pulse Ox % 95  Pulse Ox Activity Level  At rest  Oxygen Delivery Room Air/ 21 %   Brief Assessment:  GEN no acute distress   Cardiac Regular   Respiratory clear BS   Gastrointestinal tender epigastrum   Lab Results: Hepatic:  02-Mar-15 06:19   Bilirubin, Total 0.9  Alkaline Phosphatase  149 (45-117 NOTE: New Reference Range 01/05/13)  SGPT (ALT) 48  SGOT (AST)  43  Total Protein, Serum  5.1  Albumin, Serum  2.2  Routine Chem:  02-Mar-15 06:19   Glucose, Serum 87  BUN  5  Creatinine (comp) 1.02  Sodium, Serum 139  Potassium, Serum 4.1  Chloride, Serum  108  CO2, Serum 25  Calcium (Total), Serum  8.2  Osmolality (calc) 274  eGFR (African American) >60  eGFR (Non-African American) >60 (eGFR values <21m/min/1.73 m2 may be an indication of chronic kidney disease (CKD). Calculated eGFR is useful in patients with stable renal function. The eGFR calculation will not be reliable in acutely ill patients when serum creatinine is changing rapidly. It is not useful in  patients on dialysis. The eGFR calculation may not be applicable to patients at the low and high extremes of body sizes, pregnant women, and vegetarians.)  Anion Gap  6  Routine Hem:  02-Mar-15 06:19   WBC (CBC) 7.2  RBC (CBC)  2.67  Hemoglobin (CBC)  9.5  Hematocrit (CBC)  28.2  Platelet Count (CBC)  99  MCV  106  MCH  35.4  MCHC 33.5  RDW 13.9  Neutrophil % 41.4  Lymphocyte % 42.7  Monocyte % 10.4  Eosinophil % 5.1  Basophil % 0.4  Neutrophil # 3.0  Lymphocyte # 3.1  Monocyte # 0.7  Eosinophil #  0.4  Basophil # 0.0 (Result(s) reported on 16 Apr 2013 at 07:04AM.)   Assessment/Plan:  Assessment/Plan:  Assessment Abd pain. MRCP showed dilated CBD. No obvious stone or obstruction in CBD.   Plan Due to persistent sxs with LFT abnormality, will proceed with ERCP later today. thanks   Electronic Signatures: OVerdie Shire(MD)  (Signed 02-Mar-15 14:12)  Authored: Chief Complaint, VITAL SIGNS/ANCILLARY NOTES, Brief Assessment, Lab Results, Assessment/Plan   Last Updated: 02-Mar-15 14:12 by OVerdie Shire(MD)

## 2014-06-08 NOTE — Discharge Summary (Signed)
PATIENT NAME:  Teresa Copeland, Teresa Copeland MR#:  [id number] DATE OF BIRTH:  [birth year]  DATE OF ADMISSION:  04/13/2013 DATE OF DISCHARGE:  04/18/2013  FINAL DIAGNOSES: Abdominal pain, cholelithiasis, liver insufficiency, ventral incisional hernia.   PRINCIPAL PROCEDURES: CT scan of the abdomen, GI medicine consultation, ERCP, HIDA Scanning.  HOSPITAL COURSE SUMMARY: The patient was admitted with vague complaints and initial diagnosis of what looked like cholecystitis. A HIDA scan was subsequently performed, demonstrating no evidence of biliary ductal obstruction. Her liver function tests remained somewhat elevated and as such, an MRCP was performed, which demonstrated a dilated duct. ERCP was subsequently performed, demonstrating no stones. A sphincterotomy was performed. Following this, the patient had no evidence of post-ERCP pancreatitis. Her hernia remained easily reducible during her entire stay. She was tolerating a regular diet at this point with improved symptoms. It was unclear whether her gallbladder was truly the issue. The patient therefore was discharged home in stable condition and will follow up with us in the office on 04/18/2013. Discharge medications can be found on the reconciliation form. Follow up with us in 2 to 4 weeks.    ____________________________ Redge GainerMark A. Egbert GaribaldiBird, MD mab:jcm D: 04/23/2013 20:11:56 ET T: 04/23/2013 22:22:22 ET JOB#: [id number]  cc: Loraine LericheMark A. Egbert GaribaldiBird, MD, <Dictator> Bhargav Barbaro A Amri Lien MD ELECTRONICALLY SIGNED 04/24/2013 0:45

## 2014-06-08 NOTE — H&P (Signed)
PATIENT NAME:  Teresa Copeland, Teresa Copeland MR#:  [id number] DATE OF BIRTH:  September 06, 1955  DATE OF ADMISSION:  06/28/2013  REASON FOR ADMISSION: Abdominal pain, chest pain.   PRIMARY CARE PHYSICIAN: Dr. Ronna Polio   REFERRING PHYSICIAN: Dr. Daryel November  HISTORY OF PRESENT ILLNESS: This is a very nice 59 year old female with history of depression, anxiety, hypertension comes with history of abdominal pain. This has been going on for three weeks. The patient has been seen by her primary care physician. Her primary care physician referred her to the ER about a week ago, and on her way to the ER, she had a confrontation with her son. Her son said "you do not need to go to a hospital" and took her home, and she has been there drinking and feeling really bad.   The patient apparently drinks one gallon of rum every three days. She takes probably 10 big shots of rum through the day and night. The patient had today worsening of the pain with radiation to the chest and burning sensation at the level of the epigastric area. All of the pain is 9/10 in intensity, lasted for 3 to 4 hours, got better after she received some pain medications here in the ER. She had a several episodes of vomiting. She vomited at least 3 to 4 times today, and all these vomiting episodes have had some streaks of blood on it. The patient denies any melena. She denies any dizziness or lightheadedness. She just has severe pain.   The patient on evaluation in the ER, her EKG did not show any acute abnormalities, and the first set of cardiac enzymes is negative. Her lipase was 2200. The patient is admitted for evaluation of this condition. Hospitalist services were called for this.   REVIEW OF SYSTEMS: A 12-system review of systems was done.  CONSTITUTIONAL: No fever, fatigue, weight loss or weight gain.  EYES: No blurry vision or double vision.  EARS, NOSE, THROAT: No difficulty swallowing or tinnitus.  RESPIRATORY: No cough, wheezing or  hemoptysis.  CARDIOVASCULAR: Positive chest pain today as radiation of epigastric pain, burning sensation. No edema, syncope or palpitations.  GASTROINTESTINAL: Positive nausea. Positive vomiting. No diarrhea. Positive for abdominal pain. Positive hematemesis or at least streaks of blood mixed with vomit. Negative melena. Negative previous ulcers. Negative significant GERD or jaundice.  GENITOURINARY: No dysuria, hematuria, changes in frequency. GYNECOLOGIC: No breast masses. ENDOCRINOLOGY: No polyuria, polydipsia or polyphagia.  HEMATOLOGIC AND LYMPHATIC: No anemia, easy bruising or bleeding. SKIN: No rashes, petechiae,or lesions.  MUSCULOSKELETAL: No significant neck pain, back pain or gout.  NEUROLOGIC: No numbness, tingling, CVA or TIA.  PSYCHIATRIC: No insomnia. Positive depression. Positive anxiety.   PAST MEDICAL HISTORY:  1.  Hypertension.  2.  Major depressive disorder.  3.  Anxiety.  4.  Ongoing smoking.  5.  Alcohol abuse.  6.  Noncompliance.  7.  History of COPD.   ALLERGIES: THE PATIENT IS ALLERGIC TO MULTIPLE MEDICATIONS, INCLUDING KEFLEX, NSAIDS, PENICILLIN, SULFA, TETRACYCLINE, AND TRAMADOL.    PAST SURGICAL HISTORY: The patient has history of previous admission with abdominal pain for which she had a CT scan of the abdomen that showed a dilated duct. That triggered an MRCP that showed dilated duct as well. An ERCP was done with a sphincterectomy, but no stones. The patient recovered well. She was discharged. She had a HIDA scan. The HIDA scan was negative. She still has her gallbladder. She has not had any surgery.   SOCIAL HISTORY:  The patient used to live alone. Now, she moved with her son and his wife, who drink a lot. She drinks about 10 shots a day. She takes one gallon of rum, lasts for 3 days. She smokes almost 2 packs a day. She has been smoking for over 40 years. She does not do any IV drugs. She does not work.   FAMILY HISTORY: Positive for a stroke in multiple  members, including her father. Cancer of the bladder in her father. Breast cancer in her mother. Her father also had a heart attack, and her grandmother had a heart attack.   HOME MEDICATIONS: Metoprolol XL twice daily; although, the patient does not take it. The patient also takes Advair and albuterol inhalers.   PHYSICAL EXAMINATION:  VITAL SIGNS: Blood pressure 164/83, pulse 86, respiratory rate 23, temperature 99.1, oxygen saturation 97% on room air.  GENERAL: The patient is alert and oriented x3, in no acute distress. No respiratory distress. Hemodynamically stable. Whenever I came to see the patient, she was eating tater tots. I asked her not to eat anything right now.  HEENT: Pupils are equal, round, and reactive. Extraocular movements are intact. Mucosa is moist. Anicteric sclerae. Pink conjunctivae.  NECK: Supple. No JVD. No thyromegaly. No adenopathy. No carotid bruits.  CARDIOVASCULAR: Regular rate and rhythm. No murmurs, rubs, or gallops are appreciated. No displacement of PMI.  LUNGS: Clear without any wheezing. There is some rhonchi in general when she coughs and moves secretions, but this sounds like a chronic finding due to her COPD.  ABDOMEN: Overall soft but tender to palpation on the epigastric area mostly. There is no reproductivity at the level of the chest. There is no rebound tenderness. There is no guarding. Murphy sign is negative. Bowel sounds are positive.  GENITAL: Exam deferred. EXTREMITIES: No edema, cyanosis or clubbing. Pulses +2. Capillary refill less than 3.  MUSCULOSKELETAL: No joint effusions or joint swelling.  SKIN: No rashes or petechiae. Has positive dry skin. NEUROLOGIC: Cranial nerves II through XII intact. Strength is 5/5 in all 4 extremities. No focal findings.  PSYCHIATRIC: No agitation. The patient is alert and oriented x3.  LYMPHATIC: Negative for lymphadenopathy in the neck or supraclavicular areas.   FINDINGS: The patient has a lipase of 2200. EKG:  No ST depression or elevation, sinus rhythm with first-degree AV block. Troponin is negative. Glucose 111, creatinine 0.95, sodium 131. Lipase is 2200, again. White count is 11.1, hemoglobin is 13.8, platelet count 157.   ASSESSMENT AND PLAN: A 59 year old female with abdominal pain, chest pain, which was radiated from the abdominal pain, alcohol abuse, tobacco abuse, comes with acute pancreatitis, likely alcoholic.  1.  Abdominal pain with chest pain, likely secondary to alcoholic pancreatitis. The patient had a first set of cardiac enzymes negative. We are going to repeat that x2 more every four hours. This is likely not secondary to coronary artery disease; although, at least we can do troponin markers. 2.  Alcoholic pancreatitis. The patient is going to be treated with n.p.o. therapy, only ice use and p.o. medications. IV fluids 100 mL/hr plus CIWA protocol IV fluids 40 mL/hr. The patient will have an ultrasound of the right upper quadrant to evaluate for cholelithiasis, choledocholithiasis. Also, triglycerides are going to be sent, but this is likely related to alcohol. Monitor closely. Recheck lipase in the morning.  3.  Upper gastrointestinal bleeding. The patient had multiple episodes of vomiting that had streaks of blood. This could be a Mallory-Weiss tear. This could  be irritation from gastritis. We are going to get guaiac. We are going to get a consultation by GI. She does not have history of variceal bleeding in the past, but the patient is an alcoholic, so we are going to monitor her closely. Check hemoglobin every six hours for now. The patient is hemodynamically stable and her hemoglobin right now is 13.  4.  Hyponatremia. This is likely secondary to alcohol use. Continue to monitor.  5.  Increased white blood cells to 11.1. This is likely secondary to pancreatitis.  6.  Alcohol abuse. The patient on CIWA protocol. Monitor closely.  7.  Smoking, ongoing. The patient has been given smoking  cessation counseling for over four minutes. The patient also is not ready to quit, but she will accept nicotine patches here in the hospital.  8.  Gastrointestinal prophylaxis. Since the patient has a mild case of gastrointestinal bleeding versus irritation, we are going to put her on Protonix IV twice daily. Consider Protonix drip if there is any significant drop in her hemoglobin or other episodes of bleeding.  9.  Deep vein thrombosis prophylaxis. Since the patient has history of possible acute bleeding, only is going to be done with PLC'd CODE STATUS:  The patient is a FULL CODE.   TIME SPENT: I spent about 50 minutes with this patient.  ____________________________ Felipa Furnace, MD rsg:rp D: 06/28/2013 20:12:42 ET T: 06/28/2013 22:11:49 ET JOB#: [id number]  cc: Felipa Furnace, MD, <Dictator> Carra Brindley Juanda Chance MD ELECTRONICALLY SIGNED 07/06/2013 22:37

## 2014-06-08 NOTE — Discharge Summary (Signed)
PATIENT NAME:  Teresa Copeland, Teresa Copeland MR#:  [id number] DATE OF BIRTH:  August 02, 1955  DATE OF ADMISSION:  10/23/2013 DATE OF DISCHARGE:  10/26/2013  DISCHARGE DIAGNOSES: 1. Acute blood loss anemia, likely intraoperative, status post 1 unit of packed red blood cells transfusion.  2. Hyponatremia.  3. Leukocytosis.  4. Hyperkalemia.  5. Hypomagnesemia.  6. Hip fracture status post right total hip replacement, postoperative day 3, doing well.   SECONDARY DIAGNOSES: 1.  Chronic obstructive pulmonary disease.  2.  Hypertension.  3.  Hyperlipidemia. 4.  Alzheimer disease.   CONSULTATIONS: Orthopedics, Dr. Kennedy BuckerMichael Menz.   PROCEDURES AND RADIOLOGY:  1. Total hip arthroplasty on the right by Dr. Rosita KeaMenz on 8th of September.  2. Chest x-ray, on 8th of September, showed no acute cardiopulmonary disease.  3. Right hip x-ray, on 8th of September, showed transcervical right hip fracture.  4. The right hip x-ray, postoperatively, showed satisfactory position of right total hip arthroplasty.   MAJOR LABORATORY PANEL:  1.  Urinalysis on admission was negative.  2.  Stool for C. difficile was negative.  3.  Urine pregnancy test was negative.    HISTORY OF PRESENT ILLNESS AND SHORT HOSPITAL COURSE:  The patient is a 59 year old female with the above-mentioned medical problems, was admitted for hip fracture, status post fall. Please see Dr. Elias Elseiamond's dictated history and physical for further details. An orthopedic consultation was obtained with Dr. Kennedy BuckerMichael Menz, who recommended surgery, which was performed on the 8th of September. The patient, postoperatively, was doing well except  some drop in the hemoglobin for which she required 1 unit of packed red blood cell transfusion.  Since then, she is hemodynamically stable, has been working well with physical therapy and is being discharged to rehab in stable condition, per physical therapy recommendation. She did have some electrolyte imbalances, which were repleted and  resolved during the hospitalization. On the date of discharge, her vital signs are as follows: Temperature 98.8, heart rate 88 per minute, respirations 18 per minute, blood pressure 108/66 mmHg.  She is saturating 98% on room air.   PERTINENT PHYSICAL EXAMINATION ON THE DAY OF DISCHARGE:  CARDIOVASCULAR: S1, S2 normal. No murmurs, rubs, or gallop.  LUNGS: Clear to auscultation bilaterally. No wheezing, rales, rhonchi, or crepitation.  ABDOMEN: Soft, benign.  NEUROLOGIC: Nonfocal examination.  EXTREMITIES: Incision looked clean and intact with dressing in place. Minimal tenderness at the site of surgery.   All other physical examination remained at the baseline.   DISCHARGE MEDICATIONS: 1.  Advair 250/50 one puff b.i.d.  2.  Lovenox 40 mg subcutaneous daily for 14 days.  3.  Oxycodone 5 mg p.o. every 4 hours as needed.  4.  ProAir HFA 2 puffs inhaled every 4 hours as needed.   DISCHARGE DIET: Low sodium, low fat, low cholesterol.   DISCHARGE ACTIVITY: As tolerated.   DISCHARGE INSTRUCTIONS AND FOLLOWUP: The patient was instructed to follow-up with her primary care physician, Dr. Ronna PolioJennifer Walker, in 2 to 4 weeks. She will need follow-up with [facility], Dr. Kennedy BuckerMichael Menz, in 1 to 2 weeks.   Dressing changes.   TOTAL TIME DISCHARGING THIS PATIENT:  55 minutes.  ____________________________ Ellamae SiaVipul S. Sherryll BurgerShah, MD vss:lr D:  10/26/2013 11:50:44 ET  T:  10/26/2013 12:32:28 ET  JOB#: [id number] cc: Victorino DikeJennifer A. Dan HumphreysWalker, MD Leitha SchullerMichael J. Menz, MD Patricia PesaVIPUL S Dacey Milberger MD ELECTRONICALLY SIGNED 10/29/2013 11:48

## 2014-06-08 NOTE — Consult Note (Signed)
PATIENT NAME:  [name], Dametra L MR#:  [id number] DATE OF BIRTH:  [birth year]  DATE OF CONSULTATION:  10/23/2013  REFERRING PHYSICIAN:   CONSULTING PHYSICIAN:  Danelle Earthlyobin T. Eckel, MD  CHIEF COMPLAINT:  Right hip pain.   HISTORY OF PRESENT ILLNESS: Ms. Christell ConstantMoore is a 59 year old female with multiple medical problems who sustained a fall early this morning  with immediate right hip pain and inability to bear weight and she was brought to the Emergency Room for further evaluation.   PAST MEDICAL HISTORY: Significant for coronary artery disease, atrial fibrillation, COPD, hypertension, renal failure.   PAST SURGICAL HISTORY: Includes cardiac catheterization, splenectomy, appendectomy and partial hysterectomy.   CURRENT MEDICATIONS: Include prednisone and Advair   ALLERGIES: INCLUDE PENICILLIN, SULFA DRUGS, TETRACYCLINE, TRAMADOL AND NSAIDS.    SOCIAL HISTORY: She has positive for tobacco, smokes approximately 2 packs per day.   FAMILY HISTORY:  Significant for cerebrovascular disease.   REVIEW OF SYSTEMS:  She denies any loss of consciousness, headaches, dizziness, blurred vision, difficulty swallowing, chest pain, shortness of breath, nausea, vomiting or diarrhea.   PHYSICAL EXAMINATION:  GENERAL:  She is a 59 year old female who appears older than her stated age.  MUSCULOSKELETAL:  She has pain in her right hip with inability to range the right hip. She has palpable dorsalis pedis and posterior tibial pulses in her right foot and has a characteristically shortened, externally rotated posture to her right lower extremity. She does have some diffuse swelling to her right lower extremity. Her sensation is intact to light touch in all distributions.   RADIOGRAPHS: X-rays of her pelvis demonstrate a displaced right femoral neck fracture.   LABORATORY EVALUATION: Her white count is 17.9, hemoglobin 11.4, hematocrit 34.4.   ASSESSMENT: A 59 year old female with multiple medical problems and a displaced  right femoral neck fracture.   PLAN: The patient will be admitted to the hospital service for medical optimization and planned for hemi- versus total hip arthroplasty.    ____________________________ Danelle Earthlyobin T. Eckel, MD tte:aw D: 10/23/2013 03:49:17 ET T: 10/23/2013 04:17:42 ET JOB#: [id number]  cc: Danelle Earthlyobin T. Eckel, MD, <Dictator> Danelle EarthlyBIN T ECKEL MD ELECTRONICALLY SIGNED 10/24/2013 17:43

## 2014-06-08 NOTE — Op Note (Signed)
PATIENT NAME:  Teresa Copeland, Teresa Copeland MR#:  Copeland DATE OF BIRTH:  Sep 20, 1955  DATE OF PROCEDURE:  10/23/2013  PREOPERATIVE DIAGNOSES: Right femoral neck fracture, displaced and osteoarthritis of the right hip.   POSTOPERATIVE DIAGNOSES: Right femoral neck fracture, displaced and osteoarthritis of the right hip.  PROCEDURE: Right total hip replacement, direct anterior approach.   ANESTHESIA: Spinal.   DESCRIPTION OF PROCEDURE: The patient was brought to the Operating Room and after adequate spinal anesthesia was obtained, the right leg was prepped and draped in the usual sterile fashion with the right foot in the Medacta attachment, left foot on a well-padded table with a C-arm brought in preoperative template x-ray taken. After patient identification and timeout procedures were completed, direct anterior approach was made centered over the greater trochanter. The incision was carried down through the skin and subcutaneous tissue. The tensor fascia was incised and the tensor muscle retracted laterally. The deep retractor placed and the lateral femoral circumflex vessels ligated. The anterior capsule was then exposed and opened. The femoral neck fracture was identified. A femoral neck cut based on radiographic images intraoperatively was then made with femoral neck cut below the complete femoral neck fracture. The head was removed and there was moderate central osteoarthritis and bruising of the femoral head. The residual neck was also removed at this time to not have a pathologic appearance. The labrum was excised and reaming was carried out to 48 mm, at which point there was good bleeding bone. A 48 mm Versafitcup trial fit well and the final cup was impacted into place in appropriate position. Attention was then turned to the femur. With external rotation, the leg could be brought 140 degrees external rotation without capsular release. The leg was dropped into extension and sequential broaching carried out  up to a size 3. With a size 3 stem and a short head and liner, the leg lengths appeared equal, so the final components were chosen. The #3 AMIStem impacted down the canal, followed by the bipolar head with a size S 28 mm head, 48 mm liner assembled on the back table, impacted onto the head. The stem was free of any debris and after reduction the hip was very stable. The wound was thoroughly irrigated and injected with 30 mL of 0.25% Sensorcaine with epinephrine to aid in postoperative analgesia. Deep fascia was repaired using a running heavy Quill suture, 2-0 Quill subcutaneously, skin staples, Xeroform, 4 x 4's, ABD and tape.   ESTIMATED BLOOD LOSS: 400 mL.   COMPLICATIONS: None.   SPECIMEN: Removed femoral head and neck.   IMPLANTS: Medacta AMIS size 3 collared stem, a 48 mm Versafitcup DM with liner, and 28 mm S femoral head.    CONDITION: To recovery room stable.   ____________________________ Leitha SchullerMichael J. Norvell Ureste, MD mjm:TT D: 10/23/2013 20:20:57 ET T: 10/23/2013 22:55:22 ET JOB#: [id number]  cc: Leitha SchullerMichael J. Mariely Mahr, MD, <Dictator> Leitha SchullerMICHAEL J Tung Pustejovsky MD ELECTRONICALLY SIGNED 10/25/2013 8:17

## 2014-06-08 NOTE — Consult Note (Signed)
PATIENT NAME:  Teresa Copeland, Teresa Copeland MR#:  [id number] DATE OF BIRTH:  14-Mar-1955  DATE OF CONSULTATION:  04/15/2013  REFERRING PHYSICIAN:   CONSULTING PHYSICIAN:  Ezzard Standing. Savahna Casados, MD  REASON FOR REFERRAL: Epigastric/right upper quadrant pain, abnormal liver enzymes, and diarrhea. This is a 59 year old white female who was initially admitted to surgical service complaining of epigastric pain associated with nausea and vomiting. She had 1 episode of hematemesis. She has been having some diarrhea as well. Apparently this has been going on for about 2 weeks or so. There are no fevers or chills. Unfortunately, the patient is a very poor historian. She was difficult to get adequate history. 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 She tells me the diarrhea has gotten better, but she still has abdominal pain with nausea. During the hospitalization, she had a CT scan, ultrasound, and even HIDA scan which was negative. Gallbladder ultrasound and CT scan suggests some thickened gallbladder wall and distended gallbladder, suggesting gallbladder disease. However, because of her symptoms, I was asked to evaluate the patient for possible EGD or ERCP.   PAST MEDICAL HISTORY: She does have a history of COPD and hypertension. She has had a hysterectomy and dialysis access.   ALLERGIES: SHE HAS SOME ALLERGIES.  MEDICATIONS: Currently she is on a combination of Protonix daily, Cipro, fluoxetine, lorazepam, Haldol, and clonidine daily. She also takes folic acid, thiamine, multivitamins, Zofran, and morphine. She also takes Zocor daily, trazodone, metoprolol and some inhalers as well as depakote.  REVIEW OF SYSTEMS:  CONSTITUTIONAL: No fevers or chills. No fatigue or weakness. EYES: No visual changes. ENT: No hearing changes.  CARDIOVASCULAR:  There is no chest pain or palpitations. PULMONARY: No cough and no shortness of breath.  GASTROINTESTINAL: Complains of  some abdominal pain, nausea, vomiting, and diarrhea. The rest of the review of systems are negative.   PHYSICAL EXAMINATION: GENERAL: The patient appears to be in no acute distress.  VITAL SIGNS: She is afebrile. Vital signs stable.  HEAD AND NECK: Within normal limits.  CARDIAC: Regular rhythm and rate.  LUNGS: Clear bilaterally.  ABDOMEN: Shows some mild tenderness in the epigastric and right upper quadrant area. There is some ventral hernia which is in the midline area. There is some scar in the abdomen as well.  EXTREMITIES: No clubbing, cyanosis, or edema.  NEUROLOGIC: Negative except for some mild confusion at times.  SKIN: Negative.   DIAGNOSTIC DATA: In terms of labs, from March 1st: Sodium 140, potassium 4.1, chloride 108, BUN 8, creatinine 1.14. Bilirubin was 2 on admission, alkaline phosphatase 260, AST 140, ALT 141. This is slight improvement overall in the liver enzymes. White count was 7.9 and hemoglobin 10.2 on March 1st. Urinalysis was negative.   CT scan shows some distended gallbladder with ventral hernia and L1 compression fracture.   Ultrasound showed some thickened gallbladder wall with pericholecystic fluid.   HIDA scan was negative.   ASSESSMENT AND PLAN: This is a patient with persistent abdominal pain and nausea. The diarrhea has improved. Ultrasound and CT scan suggests gallbladder disease, but HIDA scan was negative. There may be a possibility of biliary tract disease because of the persistently elevated liver enzymes. I would order a MRCP first to evaluate the bile duct. If the MRCP is abnormal, then we will proceed with ERCP. If the MRCP is negative, then I may do an upper endoscopy instead. I discussed both of the procedures in detail  with the patient. I also discussed potential complications. Will schedule either of the procedures tomorrow depending on the MRCP results.   Thank you for the referral.  ____________________________ Ezzard StandingPaul Y. Bluford Kaufmannh,  MD pyo:sb D: 04/16/2013 16:11:36 ET T: 04/16/2013 16:43:33 ET JOB#: [id number]  cc: Ezzard StandingPaul Y. Bluford Kaufmannh, MD, <Dictator> Ezzard StandingPAUL Y Tylia Ewell MD ELECTRONICALLY SIGNED 04/16/2013 19:13

## 2014-06-08 NOTE — Discharge Summary (Signed)
PATIENT NAME:  Teresa Copeland, Teresa Copeland MR#:  [id number] DATE OF BIRTH:  03-Sep-1955  DATE OF ADMISSION:  06/28/2013 DATE OF DISCHARGE:  06/30/2013  PRESENTING COMPLAINT: Abdominal pain.   DISCHARGE DIAGNOSES: 1. Acute pancreatitis, alcohol-induced and due to elevated triglycerides.  2. Chronic tobacco and alcohol abuse.  3. Anxiety.  CODE STATUS: Full code.   MEDICATIONS: 1. Albuterol 2 puffs 4 times a day as needed.  2. Advair 500/50,  1 puff b.i.d.  3. Metoprolol extended-release 25 mg daily.  4. Acetaminophen/hydrocodone 5/325, 1 tablet every six hours as needed.  5. Niacin 500 mg extended-release p.o. daily.  6.Ocular lubricant 2 drops to affected eyes b.i.d.  7. Zofran 4 mg 3 times a day as needed.   DIET: Mechanical soft diet.   FOLLOWUP: PCP Dr. Ronna PolioJennifer Walker.   The patient advised to stop drinking. She is also advised to go to AA groups for her chronic alcohol abuse.   LABORATORY DATA: Lipase at discharge was 335.   Abdominal ultrasound shows significant increase in size of common bile duct. This reflects  obstruction related to pancreatitis. No obstructing mass evident. Normal appearance of  gallbladder.   Diffuse increased echogenicity of the liver, likely represents fatty liver infiltration.   Bilirubin is 1.3, SGPT is 30, SGOT is 94, magnesium 1.2. Lipase on admission was 2285. Cholesterol 368, triglycerides is 1174, HDL is 19.   BRIEF SUMMARY OF HOSPITAL COURSE:  1. Erin SonsDebra Sprigg is a 59 year old Caucasian female with history of chronic alcohol abuse comes to the Emergency Room with abdominal pain, which was suspected due to acute pancreatitis, likely alcohol-related along with elevated triglyceride. She was kept n.p.o. Her lipase was within normal limits prior to discharge. She was tolerating full liquid and soft diet. Added Niaspan for elevated triglycerides. The patient was seen by Dr. Bluford Kaufmannh,  recommends no further work-up at this time.  2. Vomiting secondary to gastritis from  alcohol abuse. The patient's H and H remained stable. There is no evidence of hematemesis. Protonix was continued.  3. Hyponatremia due to volume depletion, resolved with IV fluids.  4. Alcohol abuse. The patient was kept on CIWA protocol. She was recommended to stop drinking and follow-up outpatient with AA groups.  5. Smoking cessation was counseled as well.   Hospital stay otherwise remained stable.   CODE STATUS: The patient remained a full code.   TIME SPENT: 40 minutes.  ____________________________ Wylie HailSona A. Allena KatzPatel, MD sap:sg D: 07/01/2013 07:11:47 ET T: 07/01/2013 09:48:13 ET JOB#: [id number]  cc: Briella Hobday A. Allena KatzPatel, MD, <Dictator> Ezzard StandingPaul Y. Bluford Kaufmannh, MD Ginette PitmanJennifer A. Dan HumphreysWalker, MD  Willow OraSONA A Mohamed Portlock MD ELECTRONICALLY SIGNED 07/08/2013 16:20

## 2014-06-08 NOTE — H&P (Signed)
Subjective/Chief Complaint "chronic diarrhea"   History of Present Illness CC is chronic diarhea but has experienced nml colored loose stools for one week, has had mult emesis most recent this am and had small hemetemesis 3 d ago. no f/c, no dizziness no jaundice   Past History hyst, dialysis access for temp dialysis years ago  also had splenectomy for trauma but thought she had GB HTN, COPD, tob abuse   Past Medical Health Hypertension, Smoking, COPD   Past Med/Surgical Hx:  Atrial Fibrillation:   MRSA (currently infected):   COPD:   Renal Failure:   HTN:   Splenectomy:   Appendectomy:   Tonsillectomy:   Hysterectomy - Partial:   ALLERGIES:  NSAIDS: Anaphylaxis  Sulfa drugs: Resp. Distress  PCN: Unknown  Tramadol: Unknown  Tetracycline: Itching  Keflex: Unknown  Family and Social History:  Family History Non-Contributory   Social History positive  tobacco, positive ETOH   + Tobacco Current (within 1 year)   Place of Living Home   Review of Systems:  Fever/Chills No   Cough No   Abdominal Pain Yes   Diarrhea Yes   Constipation No   Nausea/Vomiting Yes   SOB/DOE No   Chest Pain No   Dysuria No   Tolerating Diet No  Nauseated  Vomiting   Medications/Allergies Reviewed Medications/Allergies reviewed   Physical Exam:  GEN no acute distress   HEENT pink conjunctivae   NECK supple   RESP normal resp effort  clear BS   CARD regular rate   ABD positive tenderness  positive hernia  soft   LYMPH negative neck   EXTR negative edema   SKIN normal to palpation   PSYCH alert, A+O to time, place, person, poor insight   Lab Results: Hepatic:  27-Feb-15 12:11   Bilirubin, Total  2.0  Alkaline Phosphatase  266 (45-117 NOTE: New Reference Range 01/05/13)  SGPT (ALT)  141  SGOT (AST)  140  Total Protein, Serum 7.2  Albumin, Serum  3.3  Routine Chem:  27-Feb-15 12:11   Lipase  728 (Result(s) reported on 13 Apr 2013 at 03:30PM.)   Glucose, Serum  118  BUN  20  Creatinine (comp) 1.20  Sodium, Serum  131  Potassium, Serum 4.8  Chloride, Serum 99  CO2, Serum 25  Calcium (Total), Serum 8.9  Osmolality (calc) 266  eGFR (African American)  58  eGFR (Non-African American)  50 (eGFR values <47m/min/1.73 m2 may be an indication of chronic kidney disease (CKD). Calculated eGFR is useful in patients with stable renal function. The eGFR calculation will not be reliable in acutely ill patients when serum creatinine is changing rapidly. It is not useful in  patients on dialysis. The eGFR calculation may not be applicable to patients at the low and high extremes of body sizes, pregnant women, and vegetarians.)  Anion Gap 7  Routine UA:  27-Feb-15 12:11   Color (UA) Amber  Clarity (UA) Clear  Glucose (UA) Negative  Bilirubin (UA) Negative  Ketones (UA) Negative  Specific Gravity (UA) 1.020  Blood (UA) Negative  pH (UA) 6.0  Protein (UA) Negative  Nitrite (UA) Negative  Leukocyte Esterase (UA) 1+ (Result(s) reported on 13 Apr 2013 at 03:05PM.)  RBC (UA) 1 /HPF  WBC (UA) 10 /HPF  Bacteria (UA) NONE SEEN  Epithelial Cells (UA) 5 /HPF  Mucous (UA) PRESENT (Result(s) reported on 13 Apr 2013 at 03:05PM.)  Routine Hem:  27-Feb-15 12:11   WBC (CBC) 7.2  RBC (CBC) 4.11  Hemoglobin (CBC) 14.0  Hematocrit (CBC) 42.1  Platelet Count (CBC) 162  MCV  103  MCH  34.1  MCHC 33.3  RDW 13.9  Neutrophil % 59.9  Lymphocyte % 27.3  Monocyte % 10.6  Eosinophil % 1.3  Basophil % 0.9  Neutrophil # 4.3  Lymphocyte # 2.0  Monocyte # 0.8  Eosinophil # 0.1  Basophil # 0.1 (Result(s) reported on 13 Apr 2013 at 12:27PM.)   Radiology Results: [place]:    27-Feb-15 16:22, US Abdomen Limited Survey  US Abdomen Limited Survey  REASON FOR EXAM:    RUQ pain, s/p chole 1y ago  COMMENTS:   May transport without cardiac monitor    PROCEDURE: [place]  - US ABDOMEN LIMITED SURVEY  - Apr 13 2013  4:22PM     CLINICAL DATA:  Right upper  abdominal pain. Previous cholecystectomy  per patient.    EXAM:  US ABDOMEN LIMITED - RIGHT UPPER QUADRANT    COMPARISON:  03/27/2012    FINDINGS:  Gallbladder:  Physiologically distended. Wall thickening up to 3.1 mm. There is a  small amount of pericholecystic fluid. No stones or sludge  identified.    Common bile duct:    Diameter: 4 mm, unremarkable    Liver:    No focal lesion identified. Within normal limits in parenchymal  echogenicity.     IMPRESSION:  1. Thick walled gallbladder with pericholecystic fluid.  No stones.  Electronically Signed    By: Arne Cleveland M.D.    On: 04/13/2013 16:24         Verified By: Kandis Cocking, M.D.,    Assessment/Admission Diagnosis ac chole [facility] will get CT admit, hydrate   Electronic Signatures: Florene Glen (MD)  (Signed 27-Feb-15 20:50)  Authored: CHIEF COMPLAINT and HISTORY, PAST MEDICAL/SURGIAL HISTORY, ALLERGIES, FAMILY AND SOCIAL HISTORY, REVIEW OF SYSTEMS, PHYSICAL EXAM, LABS, Radiology, ASSESSMENT AND PLAN   Last Updated: 27-Feb-15 20:50 by Florene Glen (MD)

## 2014-06-08 NOTE — H&P (Signed)
PATIENT NAME:  Teresa Copeland, Teresa Copeland MR#:  [id number] DATE OF BIRTH:  [birth year]  DATE OF ADMISSION:  10/23/2013   REFERRING PHYSICIAN: Dr. Derrill KayGoodman  PRIMARY CARE DOCTOR: Victorino DikeJennifer A. Dan HumphreysWalker, MD  ADMISSION DIAGNOSIS: Hip fracture.   HISTORY OF PRESENT ILLNESS: This is a 59 year old Caucasian female who presents to the Emergency Department with right hip pain. The patient states that she was in her kitchen when she turned from the refrigerator and tripped over her cane. She hit the floor and immediately felt pain in her right hip. She could not walk and so she crawled from the kitchen into the living room to get her cell phone to call EMS. In the Emergency Department, a hip x-ray revealed a fracture of the right hip, which prompted the ED to call for admission.   REVIEW OF SYSTEMS:  CONSTITUTIONAL: The patient denies fever, but admits to generalized weakness that has been worsening over a period of months to years.  EYES: The patient denies diplopia or inflammation.  EARS, NOSE, AND THROAT: The patient denies tinnitus or difficulty swallowing.  RESPIRATORY: The patient admits to occasional cough, but denies current shortness of breath.  CARDIOVASCULAR: The patient denies chest pain or orthopnea, but admits to some dyspnea on exertion at times.  GASTROINTESTINAL: The patient denies nausea, vomiting, diarrhea, or abdominal pain. GENITOURINARY: Patient denies dysuria, increased frequency or hesitancy.  ENDOCRINE: The patient denies polyuria or polydipsia.  HEMATOLOGIC AND LYMPHATIC: The patient denies easy bleeding or bruising.  INTEGUMENT: The patient denies rashes, but admits to lesions all over her back. They itch and start as blisters. There is no blood or pus.  MUSCULOSKELETAL: The patient admits to aches and pains all over.  NEUROLOGIC: The patient denies numbness in her limbs or dysarthria.  PSYCHIATRIC: The patient denies depression or suicidal ideation.   PAST MEDICAL HISTORY: Chronic  obstructive pulmonary disease, hypertension, hyperlipidemia, history of falls and the patient reports that she has Alzheimer's diagnosed on a disability evaluation.   PAST SURGICAL HISTORY: Hysterectomy, tonsillectomy and adenoidectomy, septoplasty for a deviated nasal septum and carpal tunnel release.   FAMILY HISTORY: Mother and father both had diabetes type 2. Her father as well as both grandmothers are deceased of stroke.   SOCIAL HISTORY: The patient has an 80 pack-year history of smoking. She is a daily drinker; she drinks less than a fifth of rum per day. She lives alone. She denies any illicit drug use.   PERTINENT LABORATORY RESULTS AND RADIOGRAPHIC FINDINGS: Glucose 101, BUN 11, creatinine 1.38, sodium is 128, potassium is 2.8, bicarbonate 16. White blood cell count 17.9, red blood cell count 11.4, hematocrit 34.4. X-ray of the chest shows emphysematous changes of the lungs, but no acute active cardiopulmonary disease. X-ray of the right hip shows a transcervical right hip fracture, as well as degenerative changes to the lower lumbar spine.   PHYSICAL EXAMINATION:  VITAL SIGNS: Temperature is 98 degrees Fahrenheit, pulse 85, respirations 18, blood pressure 130/86, pulse oximetry 99% on room air.  GENERAL: The patient is alert and oriented x 3, in no apparent distress.  HEENT: Normocephalic, atraumatic. Pupils equal, round, and reactive to light and accommodation. Extraocular movements are intact. Mucous membranes are moist.  NECK: Trachea is midline. No adenopathy.  CHEST: Symmetric and atraumatic.  CARDIOVASCULAR: Regular rate and rhythm. Normal S1, S2. No rubs, clicks, or murmurs.  LUNGS: Clear to auscultation bilaterally. Normal effort and excursion.  ABDOMEN: Positive bowel sounds. Mildly tender, some distention in the left lower quadrant  due to ventral hernia. No hepatosplenomegaly appreciated.  GENITOURINARY: Deferred.  MUSCULOSKELETAL: The right foot is everted and slightly  abducted. It hurts to flex the right leg. Her left arm and leg have full range of motion and normal strength. Her right shoulder has an atrophied lateral deltoid  with a very prominent coracoid process, indicating some previous dislocation or erosion of the labrum.  SKIN: The patient has multiple oval-shaped, unroofed lesions across her back. There is a similar lesion that is scabbed over on her left forehead. None of the lesions look infected at this time.  NEUROLOGIC: Cranial nerves II through XII are grossly intact.  PSYCHIATRIC: Mood is normal. Affect is congruent.   ASSESSMENT AND PLAN: This is a 59 year old female admitted for a hip fracture.  1.  Hip fracture. The primary goal is pain control at this time. The patient has been seen by Dr. Thornton Papas from the orthopedic service. A consult has been placed for surgery. She is moderate risk for surgery due to her smoking status and chronic obstructive pulmonary disease.  2.  Hyponatremia. We will try to correct her sodium with a normal saline infusion. Etiology is possibly cirrhosis. The patient has a history of alcohol abuse and may have some abdominal ascites masked by her ventral hernia. There are no signs or symptoms of heart failure. The patient does not seem to be hypovolemic and is likely euvolemic or fluid overloaded, indicating cirrhosis is a possible etiology.  3.  Leukocytosis, likely secondary to the steroid regimen the patient had been on for her rash. Another etiology could be stress and pain from the hip fracture. There are no symptoms of sepsis.  4.  Rash. 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 There does not seem to be any pyoderma or infection indicating potential pseudomonas colonization. The patient states that the lesions did not appear to have pus, even before she unroofed them. Continue to observe the progress of healing of this rash. Dermatology consult if deemed necessary by the primary care team.  5.   Chronic obstructive pulmonary disease. Continue home regimen of inhalers.  6.  Hypertension. The patient is not currently on home medications. Her blood pressure is controlled at this time. Continue to monitor.  7.  Hyperlipidemia. The patient is not on any medication. We will check a lipid panel and start a statin if needed.  8.  Tobacco abuse. The patient is a heavy smoker. We will place the patient on NicoDerm patches. Spirometry is not necessary prior to undergoing non thoracic surgery. Again, she is moderate risk for hip surgery.  9.  Deep vein thrombosis prophylaxis: Heparin and sequential compression devices.  10.  Gastrointestinal prophylaxis: Ranitidine.   CODE STATUS: The patient is a Full Code.   TIME SPENT ON ADMISSION ORDERS AND PATIENT CARE: Roughly 40 minutes.    ____________________________ Kelton Pillar. Sheryle Hail, MD msd:MT D: 10/23/2013 06:28:00 ET T: 10/23/2013 07:16:09 ET JOB#: [id number]  cc: Kelton Pillar. Sheryle Hail, MD, <Dictator> Kelton Pillar Marykathleen Russi MD ELECTRONICALLY SIGNED 11/02/2013 6:52

## 2014-06-09 NOTE — Consult Note (Signed)
PATIENT NAME:  Teresa Copeland, Teresa Copeland MR#:  [id number] DATE OF BIRTH:  1955-06-06  DATE OF CONSULTATION:  07/30/2011  REFERRING PHYSICIAN:  Natale Lay, MD CONSULTING PHYSICIAN:  Pasco Marchitto P. Juliene Pina, MD  REASON FOR CONSULTATION: Medical management.   PRIMARY CARE PHYSICIAN: Unknown.   IMPRESSION:  1. Postoperative day #1 status post splenectomy.  2. Postoperative respiratory failure from anesthesia and sedation with encephalopathy.  3. History of chronic obstructive pulmonary disease.  4. History of hypertension.  5. Alcohol abuse. 6. Smoker. 7. Anemia. 8. Leukocytosis. 9. Hypocalcemia. 10. Hyperglycemia. 11. Acidosis.  PLAN:  1. Agree with Dr. Belia Heman consult for vent management.  2. CIWA protocol for alcohol dependence.   3. Combivent inhaler. 4. Hold antihypertensive medications for now.  5. The patient will need  Pneumovax prior to discharge.  6. For her white blood cells, this could be secondary just to postop. Would recommend repeating a CBC in the a.m.  Continue antibiotics as you are. 7. For the low calcium, her albumin is 2.4.  It is still low for her albumin so I will check an ionized calcium level. 8. For her hyperglycemia we will just continue to monitor.  She may need a hemoglobin A1c if her glucose continues to increase. 9. Acidosis:  Bicarb is 17.  Likely due to surgery.  We will repeat basic metabolic panel in the a.m. and follow closely. 10. For her anemia would follow the hemoglobin.  At this time not requiring a blood transfusion.  HISTORY OF PRESENT ILLNESS: 59 year old female who was brought into the hospital after suffering a fall. Apparently she woke up abruptly at 4:00 with acute onset of diffuse abdominal pain. She was brought to the Emergency Room where work-up showed mild anemia and CT showed a large splenic laceration and significant hemoperitoneum. She was taken to the operating room emergently by Dr. 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 The patient is currently  intubated and sedated, so I am unable to obtain any information from her. All the information was obtained from the medical chart and the nurse.   REVIEW OF SYSTEMS: Unable to obtain as the patient is intubated.   PAST MEDICAL HISTORY:  1. Chronic obstructive pulmonary disease.  2. Hypertension.  3. Chronic renal failure.   MEDICATIONS:  1. Lisinopril 10 mg b.i.d.  2. Xanax 1 mg 1 to 2 times a day.  3. Vitamin C 1000 mg daily.  4. Ventolin HFA 2 puffs p.r.n. shortness of breath.  5. Advair Diskus 500/50 b.i.d.   FAMILY HISTORY: Unknown.   SOCIAL HISTORY: According to the chart the patient drinks 1/2 gallon every other day of liquor. Positive for tobacco use.   PAST SURGICAL HISTORY: According to the medical chart: 1. Appendectomy.  2. Tonsillectomy.  3. Partial hysterectomy.  4. The patient had a exploratory laparotomy and splenectomy on 07/29/2011.    ALLERGIES: NSAIDs, sulfa drugs, penicillin, tramadol, and Keflex.  PHYSICAL EXAMINATION: GENERAL: The patient is sedated on the ventilator.   VITAL SIGNS: Temperature 99.2, pulse 88, respirations 17, blood pressure 106/62, 100% on the ventilator.   HEENT: Unable to inspect oropharynx as patient is intubated. Pupils are round and reactive. Sclerae are anicteric.   NECK: Supple. There is no jugular venous distention or carotid bruit.   CARDIOVASCULAR: Regular rate and rhythm. No murmurs, gallops, or rubs.   LUNGS: Anteriorly clear to auscultation with some mild rhonchi, no wheezing, good air flow.   ABDOMEN: The patient has a bandage on her abdomen. She has  a JP drain. Hypoactive bowel sounds.   EXTREMITIES: No clubbing, cyanosis, or edema.   NEUROLOGIC: Hard to do assessment due to the patient being sedated on the ventilator. Babinski signs are downgoing.   SKIN: Without rash or lesions.   LABS/STUDIES: White blood cells 28.4, hemoglobin 8.2, hematocrit 25.1, platelets 124, sodium 140, potassium 4.4, chloride 112,  bicarbonate 17, BUN 12, creatinine 1.21, glucose 134, calcium 6.9, albumin 2.4.   CRITICAL CARE TIME SPENT:  60 minutes.  ____________________________ Janyth ContesSital P. Juliene PinaMody, MD spm:bjt D: 07/30/2011 13:22:25 ET T: 07/30/2011 15:34:28 ET JOB#: [id number]  cc: Loraine LericheMark A. Egbert GaribaldiBird, MD Janyth ContesSITAL P Ania Levay MD ELECTRONICALLY SIGNED 08/01/2011 21:30

## 2014-06-09 NOTE — Consult Note (Signed)
NSAIDS: Anaphylaxis  Sulfa drugs: Resp. Distress  PCN: Unknown  Tramadol: Unknown  Keflex: Unknown    Impression POD #1 splenic rupture with splenectomy after fall hx copd per chart hx NTH per chart etoh abuse 1/2 gallon of liquor QOD smoker    Plan CIWA vent managmemt per dr Luretha Murphykasa combivent  hold HTN meds  thank you will follow   Electronic Signatures for Addendum Section:  Adrian SaranMody, Kaydense Rizo (MD) (Signed Addendum 14-Jun-13 13:16)  [phone]   Electronic Signatures: Adrian SaranMody, Makaleigh Reinard (MD)  (Signed 14-Jun-13 09:17)  Authored: Home Medications, Allergies, Impression/Plan   Last Updated: 14-Jun-13 13:16 by Adrian SaranMody, Dominyk Law (MD)

## 2014-06-09 NOTE — H&P (Signed)
Subjective/Chief Complaint severe abdominal pain    History of Present Illness 59 year old female fell at home 1 week ago onto right side 1 week to right hip.  no abdominal pain last week.  Awoke abruptly at 4 am this morning with acute onset diffuse abdominal pain.  No sick contacts, no fevers.  Work-up in ER showed mild anemia and CT scan shows large splenic laceration and significant hemoperitoneum.    Past History arthritis COPD  tobacco abuse and dependence.   Past Med/Surgical Hx:  COPD:   Renal Failure:   HTN:   Appendectomy:   Tonsillectomy:   Hysterectomy - Partial:   ALLERGIES:  NSAIDS: Anaphylaxis  Sulfa drugs: Resp. Distress  PCN: Unknown  Tramadol: Unknown  Keflex: Unknown    Other Allergies none.   HOME MEDICATIONS: Medication Instructions Status  Advair Diskus 500 mcg-50 mcg inhalation powder 1 puff(s) inhaled 2 times a day Active  Ventolin HFA 90 mcg/inh inhalation aerosol 2 puff(s) inhaled , As Needed- for Shortness of Breath  Active  lisinopril 10 mg oral tablet 1 tab(s) orally 2 times a day Active  alprazolam 1 mg oral tablet 1 tab(s) orally 1 to 2 times a day Active  Vitamin C 1000 mg oral tablet 1 tab(s) orally once a day (in the morning) Active   Family and Social History:   Family History Non-Contributory    Social History positive  tobacco, negative ETOH, negative Illicit drugs    + Tobacco Current (within 1 year)    Place of Living Home  lives alone.   Review of Systems:   Subjective/Chief Complaint see above.    Fever/Chills No    Abdominal Pain Yes    Dysuria No    Tolerating Diet Yes    Medications/Allergies Reviewed Medications/Allergies reviewed   Physical Exam:   GEN no acute distress, thin, disheveled, tem 96.9 bp 106/81 p 115    HEENT pale conjunctivae    NECK supple    RESP normal resp effort    CARD regular rate  tachycardic.    ABD positive tenderness  soft  distended  normal BS    LYMPH negative neck     EXTR negative cyanosis/clubbing    SKIN normal to palpation    NEURO cranial nerves intact    PSYCH A+O to time, place, person   Lab Results:  Hepatic:  13-Jun-13 10:56    Bilirubin, Total 0.5   Alkaline Phosphatase 112   SGPT (ALT) 36 (12-78 NOTE: NEW REFERENCE RANGE 01/08/2011)   SGOT (AST) 36   Total Protein, Serum 6.7   Albumin, Serum 3.5  Routine Chem:  13-Jun-13 10:56    Lipase 213 (Result(s) reported on 29 Jul 2011 at 12:17PM.)   Glucose, Serum  153   BUN 13   Creatinine (comp) 0.90   Sodium, Serum 137   Potassium, Serum 3.9   Chloride, Serum 105   CO2, Serum  20   Calcium (Total), Serum 8.7   Osmolality (calc) 277   eGFR (African American) >60   eGFR (Non-African American) >60 (eGFR values <10m/min/1.73 m2 may be an indication of chronic kidney disease (CKD). Calculated eGFR is useful in patients with stable renal function. The eGFR calculation will not be reliable in acutely ill patients when serum creatinine is changing rapidly. It is not useful in  patients on dialysis. The eGFR calculation may not be applicable to patients at the low and high extremes of body sizes, pregnant women, and vegetarians.)  Anion Gap 12    14:50    Amylase, Serum 31 (Result(s) reported on 29 Jul 2011 at 03:04PM.)  Routine UA:  13-Jun-13 13:15    Color (UA) Yellow   Clarity (UA) Cloudy   Glucose (UA) Negative   Bilirubin (UA) Negative   Ketones (UA) Negative   Specific Gravity (UA) 1.019   Blood (UA) Negative   pH (UA) 5.0   Protein (UA) Negative   Nitrite (UA) Negative   Leukocyte Esterase (UA) 1+ (Result(s) reported on 29 Jul 2011 at 01:32PM.)   RBC (UA) 2 /HPF   WBC (UA) 6 /HPF   Bacteria (UA) NONE SEEN   Epithelial Cells (UA) 7 /HPF   Transitional Epithelial (UA) <1 /HPF   Hyaline Cast (UA) 1 /LPF   Amorphous Crystal (UA) PRESENT (Result(s) reported on 29 Jul 2011 at 01:32PM.)  Routine Hem:  13-Jun-13 10:56    WBC (CBC)  13.6   RBC (CBC)  2.97   Hemoglobin  (CBC)  10.4   Hematocrit (CBC)  30.1   Platelet Count (CBC) 175 (Result(s) reported on 29 Jul 2011 at 11:52AM.)   MCV  101   MCH  35.1   MCHC 34.7   RDW 12.0   Radiology Results: XRay:    07-Mar-13 12:11, Chest PA and Lateral ([place])   Chest PA and Lateral ([place])   REASON FOR EXAM:    hurts to breathe  COMMENTS:       PROCEDURE: MDR - MDR CHEST PA(OR AP) AND LATERAL  - Apr 22 2011 12:11PM     RESULT: Comparison: 12/25/2009    Findings:  The heart and mediastinum are stable. There is a subtle density at the   periphery left midlung which may represent atelectasis or scarring.   Otherwise, no focal pulmonary opacities.    IMPRESSION:   Subtle density at the periphery left midlung may represent an area of   atelectasis or scarring. However, followup chest radiographs are     recommended to ensure stability/resolution.          Verified By: Gregor Hams, M.D., MD     Assessment/Admission Diagnosis 59 year old female with what looks like delayed sppleinc rupture 7 days following fall. COPD tobacco abuse and dependence.    Plan Admit, npo stat type and screen and cbc high likelihood of needing splenectomy if developes hypotension or hct falls. discussed in detail with patient.   Electronic Signatures: Sherri Rad (MD)  (Signed 13-Jun-13 17:10)  Authored: CHIEF COMPLAINT and HISTORY, PAST MEDICAL/SURGIAL HISTORY, ALLERGIES, Other Allergies, HOME MEDICATIONS, FAMILY AND SOCIAL HISTORY, REVIEW OF SYSTEMS, PHYSICAL EXAM, LABS, Radiology, ASSESSMENT AND PLAN   Last Updated: 13-Jun-13 17:10 by Sherri Rad (MD)

## 2014-06-09 NOTE — Discharge Summary (Signed)
PATIENT NAME:  Teresa Copeland, Indigo L MR#:  [id number] DATE OF BIRTH:  Apr 10, 1955  DATE OF ADMISSION:  07/29/2011 DATE OF DISCHARGE:  08/05/2011  FINAL DIAGNOSES:  1. Delayed splenic rupture secondary to trauma.  2. Severe chronic obstructive pulmonary disease.   PRINCIPLE PROCEDURES:  1. CT scan of the abdomen and pelvis.  2. Exploratory laparotomy and splenectomy.  3. Mechanical ventilation.  4. Steroid taper for chronic obstructive pulmonary disease exacerbation.  5. Pulmonary Medicine consultation.  6. Internal Medicine consultation.   HOSPITAL COURSE SUMMARY: The patient was admitted, was evaluated in the Emergency Room with a delayed splenic rupture and hypotension and severe blood loss anemia. She was urgently taken to the operating room and exploratory laparotomy and splenectomy was performed. Postoperatively she remained on ventilatory support in the Intensive Care Unit and received a total of 4 units of blood postoperatively. Following this her hematocrit remained stable. Pulmonary Medicine and Internal Medicine placed her on a high dose Solu-Medrol taper for her COPD exacerbation. She was able to be successfully weaned from the ventilator. Nasogastric tube was able to be removed. Jackson-Pratt drain was able to be removed when she was eating and tolerating a regular diet. Her wound was healing nicely and staples were deferred to be removed as an outpatient due to the steroid dose. The patient was transitioned to the ward, continued to do well. She did develop C. difficile colitis for which she was treated with oral Flagyl. The patient continued to improve with more formed bowel movements and was discharged home in stable condition on June 20th. She will follow-up with Dr. Dan HumphreysWalker on July 8th at which point we can proceed with pneumococcal Haemophilus influenza and meningococcal vaccinations.   DISCHARGE MEDICATIONS:  1. Flagyl 500 mg by mouth every eight hours for a total of 12 days.  2. Advair  Diskus. 3. Ventolin. 4. Lisinopril. 5. Alprazolam. 6. Vitamin C. 7. Norco 5/325 1 to 2 tabs p.o. q.4 to 6 hours p.r.n. pain. 8. Prednisone taper 30 mg, 20 mg, 10 mg daily, then stop.  ____________________________ Redge GainerMark A. Egbert GaribaldiBird, MD mab:drc D: 08/17/2011 09:42:47 ET T: 08/17/2011 11:41:03 ET JOB#: [id number]  cc: Loraine LericheMark A. Egbert GaribaldiBird, MD, <Dictator> Ginette PitmanJennifer A. Dan HumphreysWalker, MD Arbie Blankley Kela MillinA Isaah Furry MD ELECTRONICALLY SIGNED 08/19/2011 16:15

## 2014-06-09 NOTE — Op Note (Signed)
PATIENT NAME:  Teresa Copeland, Teresa Copeland MR#:  [id number] DATE OF BIRTH:  1955-10-02  DATE OF PROCEDURE:  07/29/2011  PREOPERATIVE DIAGNOSIS: Delayed splenic rupture secondary to trauma.  POSTOPERATIVE DIAGNOSIS: Delayed splenic rupture secondary to trauma.  PROCEDURE:  1. Exploratory laparotomy. 2. Lysis of adhesions. 3. Splenectomy.  SURGEON: Dr. Raynald KempMark A Demarius Archila, MD, FACS  ASSISTANT:  Dr. Hulda Marinimothy Oaks, MD FACS  ANESTHESIA: General endotracheal.  FINDINGS:  1800 mL of clotted blood in the abdomen. There was a significant laceration of the spleen through the hilum with rupture of the capsule.  The liver and remaining intraabdominal organs appeared normal to gross inspection.  DRAINS: 19-mm in the left upper quadrant, nasogastric tube, and Foley.  ESTIMATED OPERATIVE BLOOD LOSS:  50 mL.  DESCRIPTION OF THE PROCEDURE:  The patient's abdomen was widely prepped and draped utilizing ChloraPrep solution. A surgical time-out was observed. Perioperative antibiotics were administered. SCDs were applied. A time-out was observed. The abdomen was entered through a midline incision. A large amount of blood was encountered. All 4 quadrants packed with laparotomy tapes. Self-retaining retractor was placed on the abdominal wall was placed.  Packs were then removed. The abdomen was irrigated of blood and the left upper quadrant demonstrated a large organized clot, which was evacuated. The spleen was then delivered to the midline with gentle traction. Hilar vessels were then taken with clamps and ties of #0 silk suture with double ties on larger vessels. The left upper quadrant was then packed with several laparotomy tapes. Adhesiolysis of the anterior abdominal wall of the omentum to the anterior abdominal wall was taken down with electrocautery. Small bowel was run in its entirety. The appendix was surgically absent. Small bowel was normal. The colon was normal. Gallbladder was normal. Both lobes of the liver appeared to be  normal. The stomach was normal. Nasogastric tube was passed by anesthesia and was confirmed into the stomach by palpation. Inspection for hemostasis in the left upper quadrant was then performed.  The left upper quadrant was irrigated with 500 mL of warm normal saline and aspirated dry. Valsalva maneuver was performed and no site of bleeding was encountered. A 19-mm Blake drain was then directed into the space through a separate stab incision in the left lower quadrant. Drain site was secured with nylon suture. With lap and needle counts correct times two, the abdominal fascia was closed from the extremes of the wound with running #1 Vicryl, interrupted with #1 Vicryls as internal retention sutures. The subcutaneous tissues were irrigated. Skin edges were reapproximated utilizing a skin stapler. A sterile dressing was applied. The patient was then taken to the recovery room in stable and satisfactory condition by anesthesia.    ____________________________ Redge GainerMark A. Egbert GaribaldiBird, MD mab:bjt D: 07/30/2011 08:12:00 ET T: 07/30/2011 09:46:32 ET JOB#: [id number]  cc: Loraine LericheMark A. Egbert GaribaldiBird, MD, <Dictator> Croy Drumwright A Akari Defelice MD ELECTRONICALLY SIGNED 07/31/2011 1:41

## 2014-06-25 NOTE — H&P (Signed)
PATIENT NAME:  Teresa Copeland, Teresa Copeland [id number] OF BIRTH:  12/01/1955 OF ADMISSION: 10/31/2012 PHYSICIAN:  Sharman CheekPhillip Stafford, MDPHYSICIAN:  Kristine LineaJolanta Anelis Hrivnak, MD DATA: [name] is a 59 year old female with a history of substance abuse.  COMPLAINT: "I am ready to go home. I can do this on my own."  OF PRESENT ILLNESS: [name] has a long history of alcoholism. When asked what the longest period of sobriety was, she laughs.  Her habit escalated and now includes narcotic pain killers after she had been awarded a lump sum of money from [employer]?s comp settlement. She spent almost half of it on drugs already. She denies any symptoms of depression, anxiety or psychosis. There are no symptoms suggestive of bipolar mania. She admits that she has been falling quite a bit while intoxicated. This is particularly troublesome as she lives in an apartment located on the second store. She already broke her foot a month ago. She admits to drinking three drinks of vodka every day. She takes narcotic pain killers. She is positive for benzodiazepines. There is a history of cognitive decline. The patient noticed short term memory problems. The family criticizes her for repeating herself. Actually, she apologizes several times for repeating herself, even though she is not.  PSYCHIATRIC HISTORY: She denies ever being treated for mental illness or substance abuse. She has never been to AA.   PSYCHIATRIC HISTORY: None reported. MEDICAL HISTORY: COPD, HTN, falls.   ON ADMISSION:  Percocet 5/325 mg 1 tablet every 6 hours. Advair Diskus 500 mcg 1 puff 2 times per day. Albuterol inhaler 2 puffs 4 times a day as needed for shortness of breath. Toprol XL 50 mcg extended release once a day. Elavil as needed.   ALLERGIES: KEFLEX, NSAIDS, PENICILLIN, SULFA DRUGS, TETRACYCLINE, TRAMADOL.  HISTORY: The patient is separated. She has a son who lives in our area, who brought her to the ER and insists on treatment. She used to work for [employer] until January  when she injured herself on the job. She had a successful work career in [place] She will shortly relocate to a house owned by her son to avoid stairs.  She has a 59-year-old grandson. 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  OF SYSTEMS:No fever or chills. No weight changes. No double or blurred vision. Occasional shortness of breath.  No chest pain or orthopnea. No abdominal pain, nausea, vomiting or diarrhea. No incontinence or frequency. No heat or cold intolerance. No anemia or easy bruising. No acne or rash. No muscle or joint pain. Complains of pain in unhealed ankle.  EXAMINATION:SIGNS: Temperature 98.2, pulse 78, respirations 18, blood pressure 164/91. GENERAL: This is an elderly female in no acute distress. The pupils are equal, round and reactive to light. Sclerae are anicteric. Supple. No thyromegaly. Clear to auscultation. No dullness to percussion. Regular rhythm and rate. No murmurs, rubs or gallops. Soft, nontender, nondistended. Positive bowel sounds. Normal muscle strength in all extremities. No rashes or bruises. No cervical adenopathy. Cranial nerves II through XII were intact.   DATA: Glucose 142, BUN 4, creatinine 0.90, sodium 142, potassium 2.9, chloride 115, bicarbonate 16, anion gap 11, osmolality 282, calcium 8.5. Blood alcohol level 243. Protein 6.7, albumin 3.2, bilirubin 0.4, alkaline phosphatase 194, AST 37, ALT 23. CK 64, CK-MB 1.8. UDS positive for benzodiazepines. WBC 12.6, RBC 3.58, hemoglobin 12.9, hematocrit 37.7, MCV 105.   MENTAL STATUS EXAMINATION ON ADMISSION: The patient is alert and oriented to person, place, time  and situation. She is pleasant, polite and cooperative. She is marginally groomed. She is wearing hospital scrubs. She maintains good eye contact. Her speech is slightly pressured. Mood is "fine" with labile affect. Thought processing is logical. She denies suicidal or homicidal ideation, paranoia,  delusions or hallucinations. Her cognition is grossly intact. She registers 3 out of 3 and recalls 3 out of 3 objects after 5 minutes. She can spell "world" forward and backward. She knows current president. Her insight and judgment are questionable.  RISK ASSESSMENT ON ADMISSION: This is a patient with a long history of substance abuse and now cognitive decline who came to the hospital for detox.    DIAGNOSES: I:  Alcohol dependence.    Opioid abuse.abuse.disorder NOS. out bipolar disorder.  AXIS II:  Deferred. III:  Hypertension, hypokalemia, COPD, fall, fractured foot.  IV:  Substance abuse, poor insight, family conflict.  V:  Global Assessment of Functioning on admission 35.  The [facility] for safety, stabilization and medication management. She was initially placed on suicide precautions and was closely monitored for any unsafe behaviors. She underwent full psychiatric and risk assessment. She received pharmacotherapy, individual and group psychotherapy, substance abuse counseling and support from therapeutic milieu.   Alcohol detox: She was started on Librium by admitting psychiatrist. She denies any symptoms of opioid withdrawal.  Substance abuse treatment: The patient believes that she can handle it.   Mood: She denies psychiatric past. We will obtain collateral data. There is grandiosity, pressured speech, racing thoughts, hyperactivity, paninsomnia, spending spree.  Cognitive decline: It is likely that the patient has been intoxicated most of the time for the past 4 months since she has money presenting as cognitive decline. Dementia workup.  Disposition: To home with family, possibly rehab.    Electronic Signatures: Kristine LineaPucilowska, Sian Joles (MD)  (Signed on 17-Sep-14 20:25)  Authored  Last Updated: 17-Sep-14 20:25 by Kristine LineaPucilowska, Tiyon Sanor (MD)

## 2015-04-03 ENCOUNTER — Other Ambulatory Visit: Payer: Self-pay | Admitting: Oncology

## 2015-05-24 IMAGING — CR RIGHT FOOT COMPLETE - 3+ VIEW
1 series · 3 of 3 positions shown · non-contrast
Comparison: none

REASON FOR EXAM: pain 2nd to fall
COMMENTS:   May transport without cardiac monitor

PROCEDURE:     DXR - DXR FOOT RT COMPLETE W/OBLIQUES  - October 04, 2012  [DATE]
RESULT:

[Series 1: ap · 0.17mm/px · 3 of 3 slices shown]
[im 1/3]
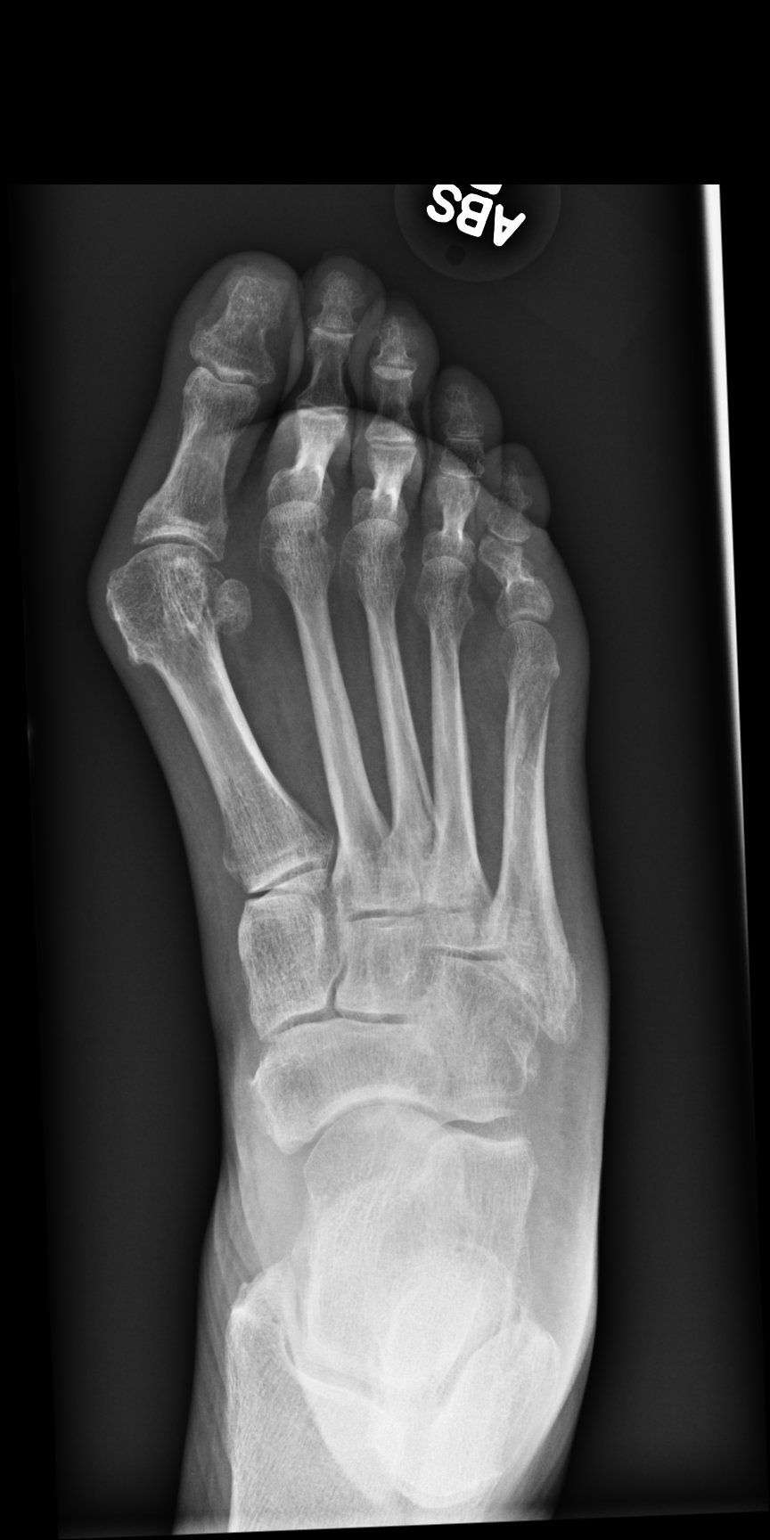
[im 2/3]
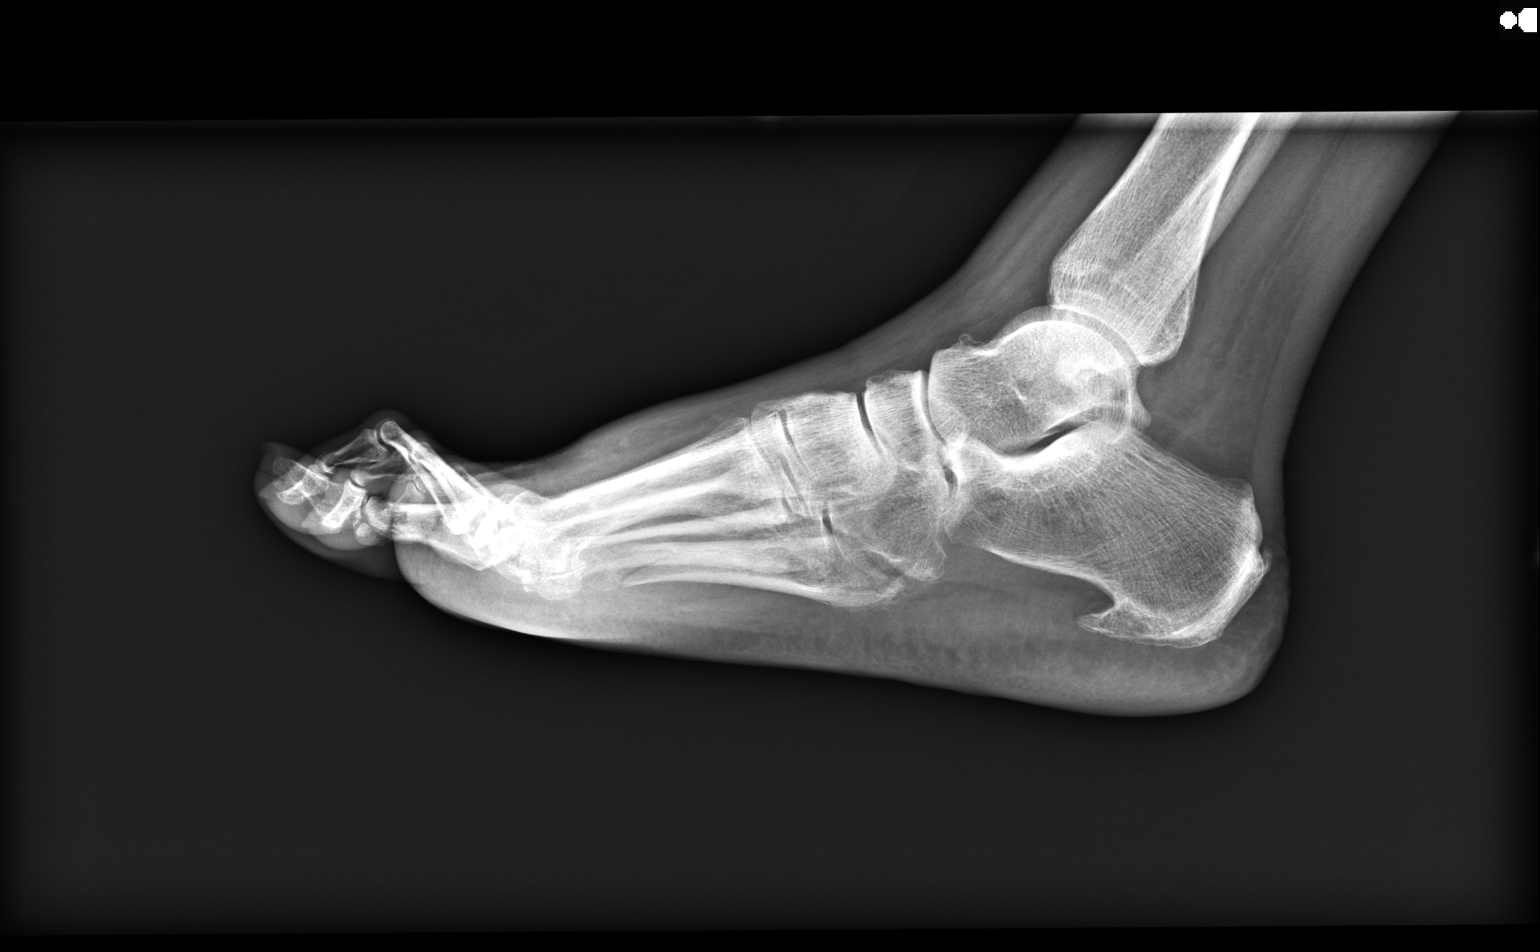
[im 3/3]
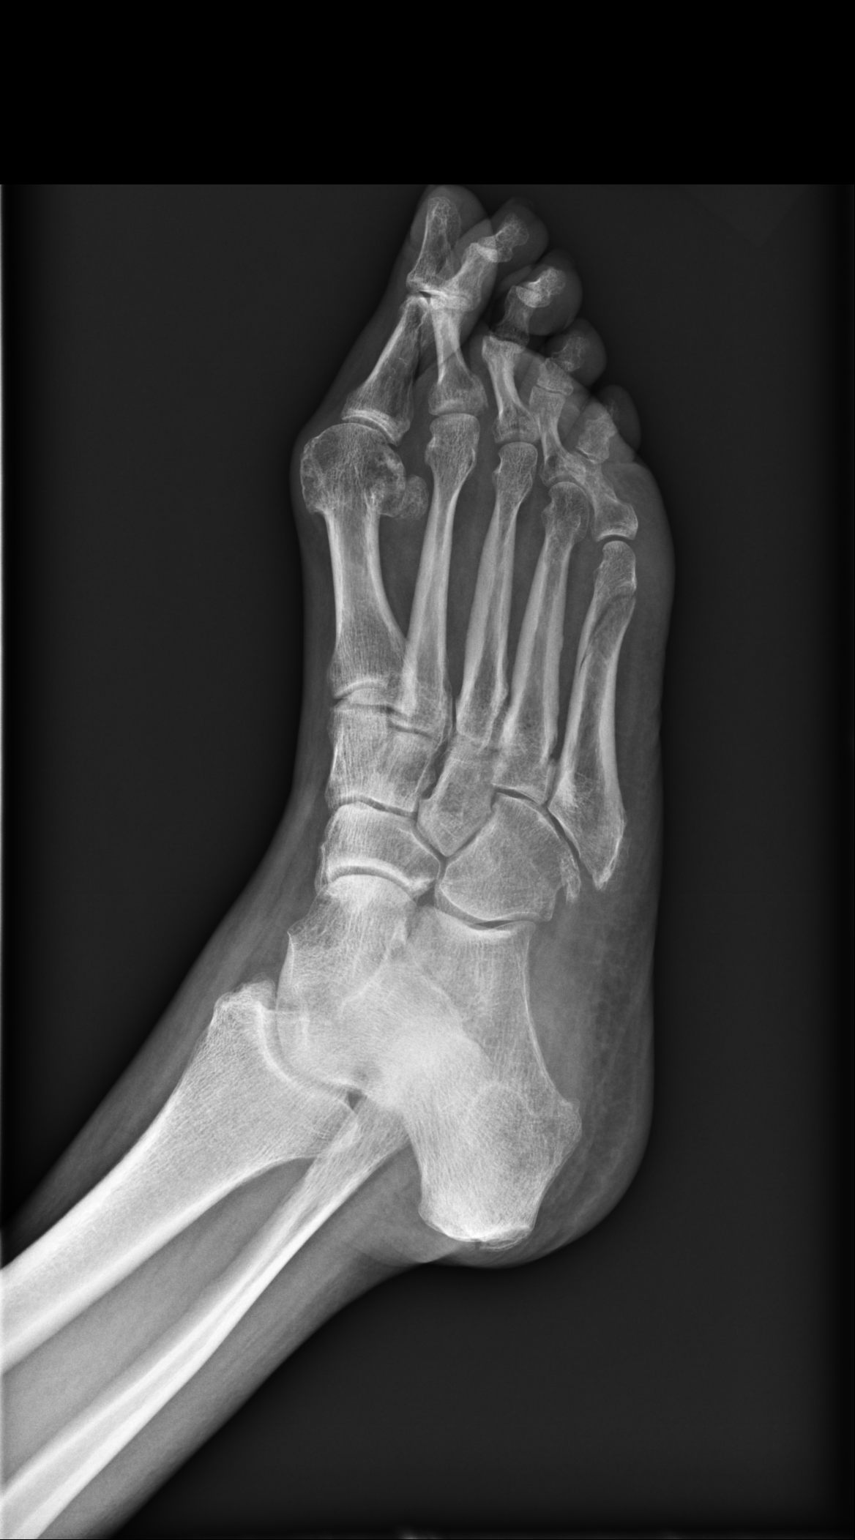

[3 of 3 positions shown; findings below may reference images not displayed]

FINDINGS: A nondisplaced oblique fracture is identified along the distal
shaft of the fifth metatarsal.
IMPRESSION: Fifth metatarsal fracture.

## 2015-06-19 IMAGING — CT CT HEAD WITHOUT CONTRAST
2 of 4 series · 16 of 30 positions shown, 19 images · non-contrast
Comparison: none

REASON FOR EXAM: recent falls
COMMENTS:

[Series 2: without · axial · non-contrast · 0.44mm/px · z∈[-222,-92]mm · 10 of 32 slices shown, 13 images]
[im 3/32  brain]
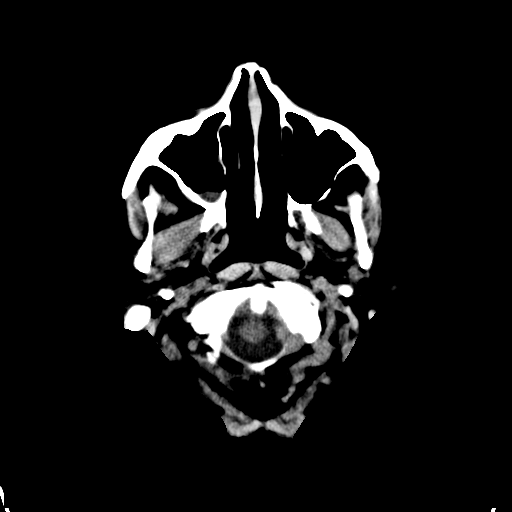
[im 3/32  bone]
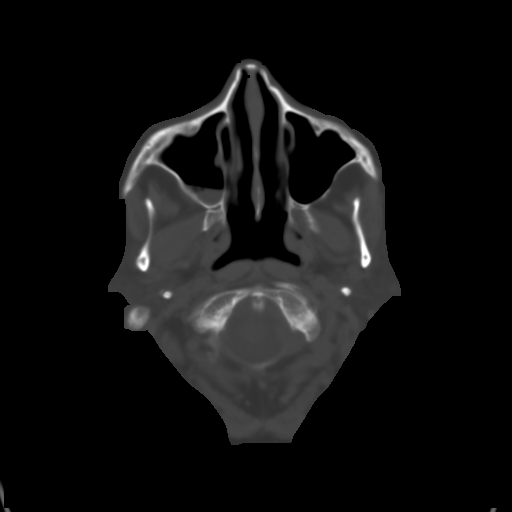
[im 6/32  brain]
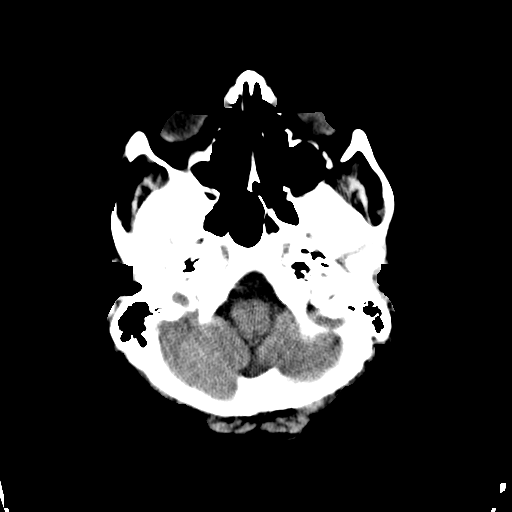
[im 9/32  brain]
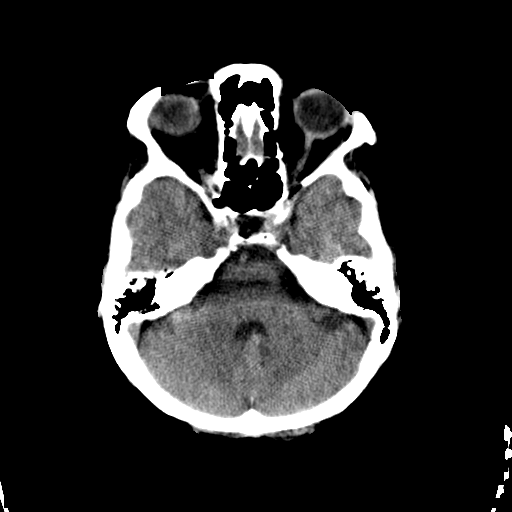
[im 12/32  brain]
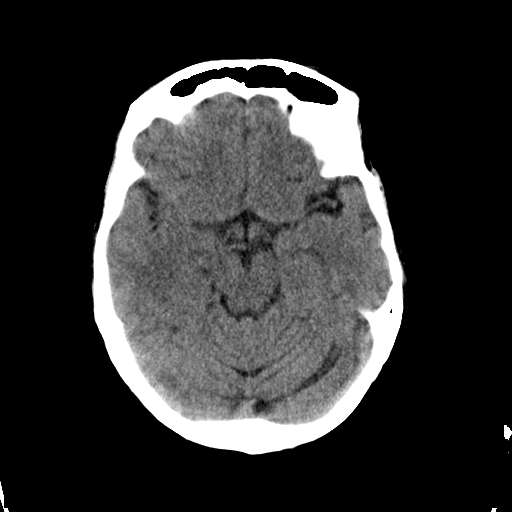
[im 15/32  brain]
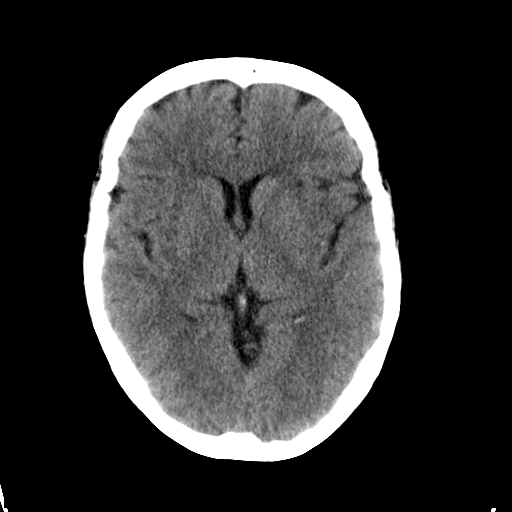
[im 15/32  bone]
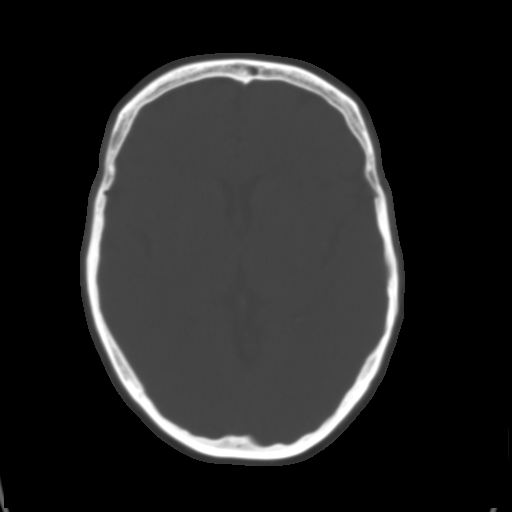
[im 17/32  brain]
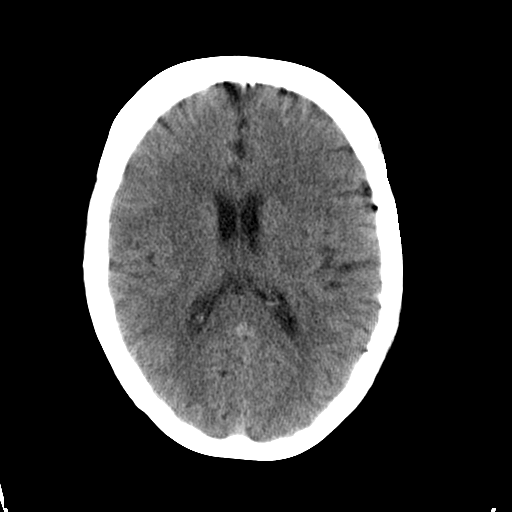
[im 20/32  brain]
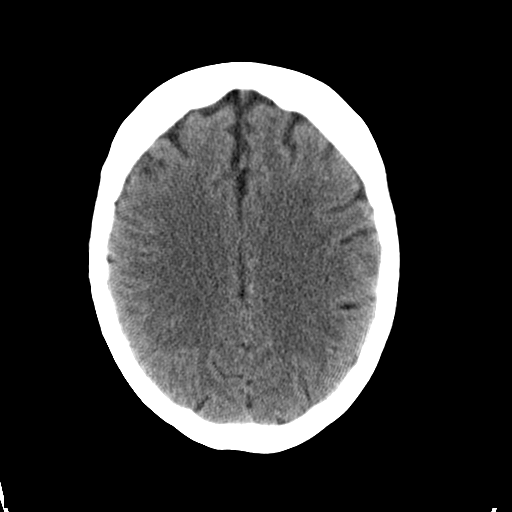
[im 23/32  brain]
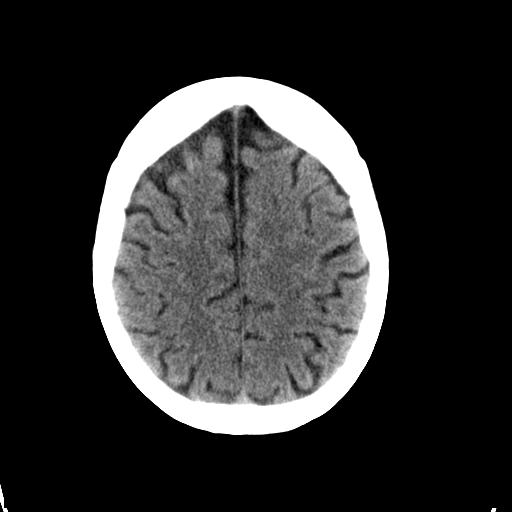
[im 26/32  brain]
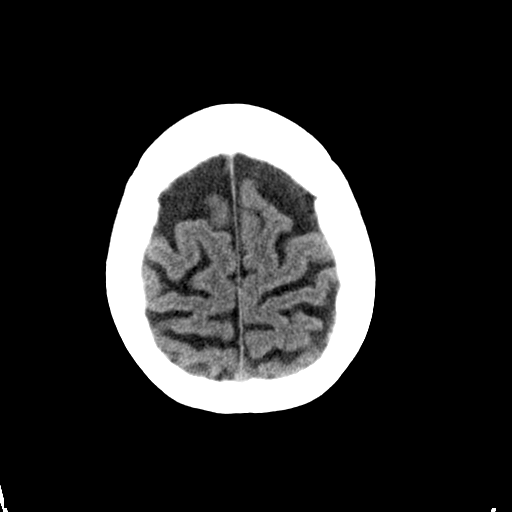
[im 26/32  bone]
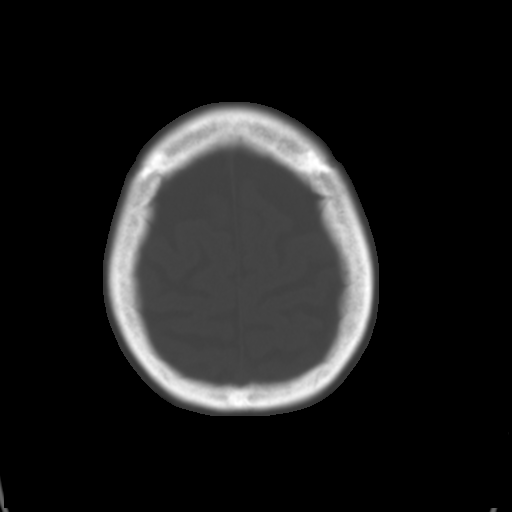
[im 29/32  brain]
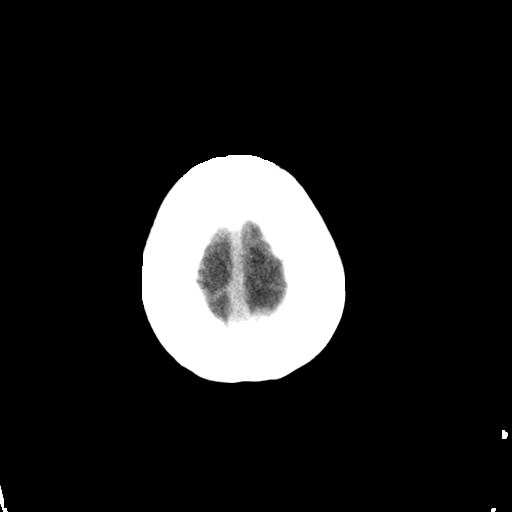

[Series 3: bone · axial · 0.44mm/px · z∈[-222,-138]mm · 6 of 32 slices shown]
[im 3/32  bone]
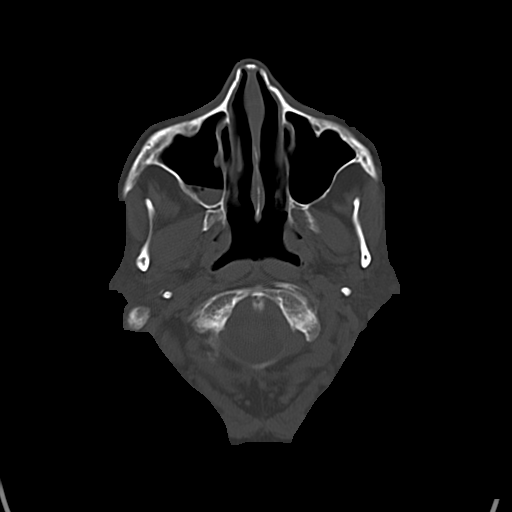
[im 6/32  bone]
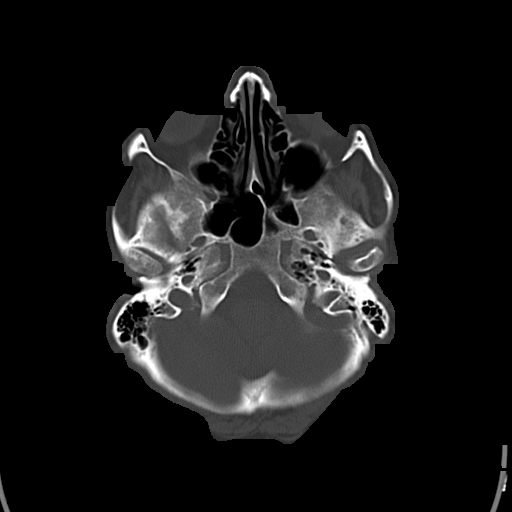
[im 12/32  bone]
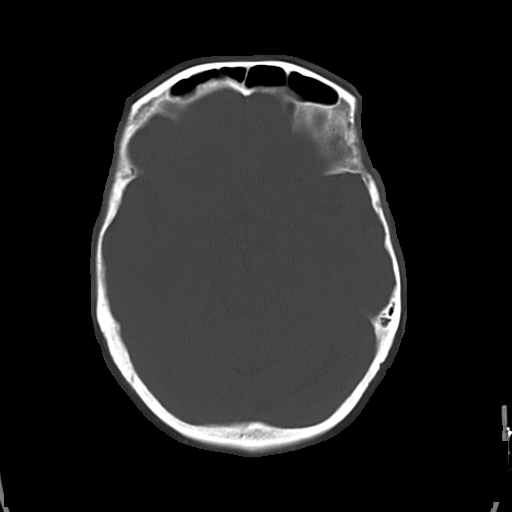
[im 15/32  bone]
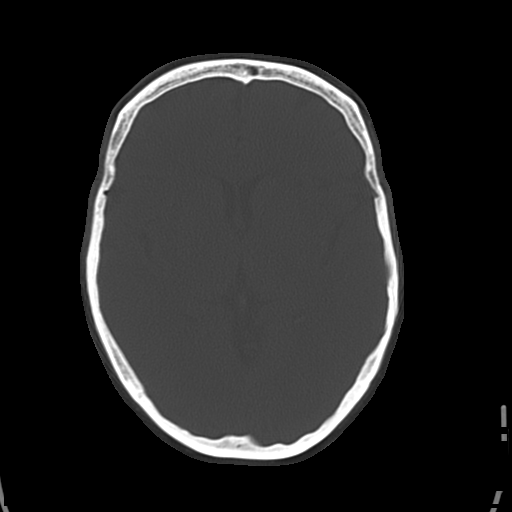
[im 17/32  bone]
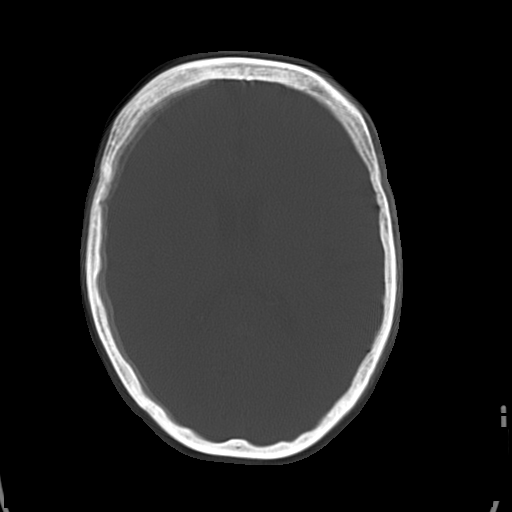
[im 20/32  bone]
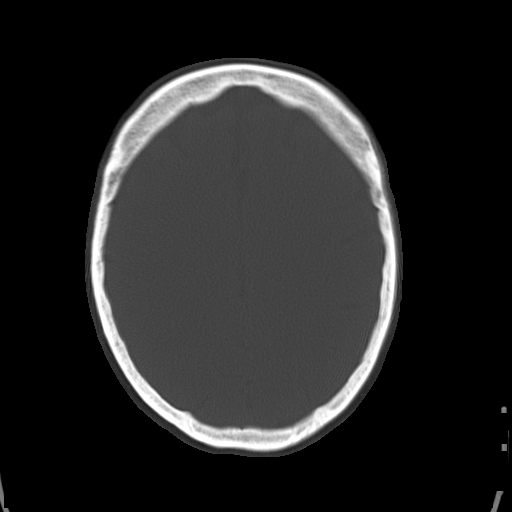

[16 of 30 positions shown; findings below may reference images not displayed]

PROCEDURE:     CT  - CT HEAD WITHOUT CONTRAST  - October 30, 2012  [DATE]

RESULT:     Axial noncontrast CT scanning was performed through the brain
with reconstructions at 5 mm intervals and slice thicknesses. Comparison is
made to the study April 25, 2012.

The ventricles are normal in size and position. There is no intracranial
hemorrhage nor intracranial mass effect. The cerebellum and brainstem are
normal in density. There are no abnormal intracranial calcifications. There
is no evidence of an evolving ischemic infarction. At bone window settings
there is mucoperiosteal thickening in the right maxillary sinus. There is no
evidence of an acute skull fracture.
IMPRESSION: 1. There is no evidence of an acute ischemic or hemorrhagic infarction.
2. There is no intracranial mass effect nor hydrocephalus.
3. There is no evidence of an acute skull fracture. There are may be
inflammation of the right maxillary sinus.

[REDACTED]
